# Patient Record
Sex: Male | Born: 1945 | Race: White | Hispanic: No | Marital: Married | Smoking: Never smoker
Health system: Southern US, Community
[De-identification: ages and names within clinical notes are randomized; demographics above are authoritative.]

## PROBLEM LIST (undated history)

## (undated) DIAGNOSIS — T7840XA Allergy, unspecified, initial encounter: Secondary | ICD-10-CM

## (undated) DIAGNOSIS — G250 Essential tremor: Secondary | ICD-10-CM

## (undated) DIAGNOSIS — I8290 Acute embolism and thrombosis of unspecified vein: Secondary | ICD-10-CM

## (undated) DIAGNOSIS — N201 Calculus of ureter: Secondary | ICD-10-CM

## (undated) DIAGNOSIS — Z87442 Personal history of urinary calculi: Secondary | ICD-10-CM

## (undated) HISTORY — PX: COLONOSCOPY: SHX174

## (undated) HISTORY — PX: OTHER SURGICAL HISTORY: SHX169

---

## 1996-10-02 HISTORY — PX: OTHER SURGICAL HISTORY: SHX169

## 1999-11-24 ENCOUNTER — Encounter: Admission: RE | Admit: 1999-11-24 | Discharge: 1999-11-24 | Payer: Self-pay | Admitting: Gastroenterology

## 1999-11-24 ENCOUNTER — Encounter: Payer: Self-pay | Admitting: Gastroenterology

## 2002-10-02 DIAGNOSIS — I8290 Acute embolism and thrombosis of unspecified vein: Secondary | ICD-10-CM

## 2002-10-02 HISTORY — DX: Acute embolism and thrombosis of unspecified vein: I82.90

## 2007-05-27 ENCOUNTER — Encounter: Admission: RE | Admit: 2007-05-27 | Discharge: 2007-05-27 | Payer: Self-pay | Admitting: Family Medicine

## 2013-07-03 ENCOUNTER — Other Ambulatory Visit: Payer: Self-pay | Admitting: Internal Medicine

## 2013-07-03 DIAGNOSIS — R109 Unspecified abdominal pain: Secondary | ICD-10-CM

## 2013-07-07 ENCOUNTER — Ambulatory Visit
Admission: RE | Admit: 2013-07-07 | Discharge: 2013-07-07 | Disposition: A | Discharge status: Home / Self Care | Payer: BC Managed Care – PPO | Source: Ambulatory Visit | Attending: Internal Medicine | Admitting: Internal Medicine

## 2013-07-07 ENCOUNTER — Other Ambulatory Visit: Payer: Self-pay

## 2013-07-07 DIAGNOSIS — R109 Unspecified abdominal pain: Secondary | ICD-10-CM

## 2013-07-07 MED ORDER — IOHEXOL 300 MG/ML  SOLN
100.0000 mL | Freq: Once | INTRAMUSCULAR | Status: AC | PRN
  Administered 2013-07-07: 100 mL via INTRAVENOUS

## 2016-10-09 DIAGNOSIS — J301 Allergic rhinitis due to pollen: Secondary | ICD-10-CM | POA: Diagnosis not present

## 2016-10-13 DIAGNOSIS — J301 Allergic rhinitis due to pollen: Secondary | ICD-10-CM | POA: Diagnosis not present

## 2016-11-08 DIAGNOSIS — J301 Allergic rhinitis due to pollen: Secondary | ICD-10-CM | POA: Diagnosis not present

## 2016-12-05 DIAGNOSIS — J301 Allergic rhinitis due to pollen: Secondary | ICD-10-CM | POA: Diagnosis not present

## 2017-01-01 DIAGNOSIS — J301 Allergic rhinitis due to pollen: Secondary | ICD-10-CM | POA: Diagnosis not present

## 2017-01-09 DIAGNOSIS — Z125 Encounter for screening for malignant neoplasm of prostate: Secondary | ICD-10-CM | POA: Diagnosis not present

## 2017-01-09 DIAGNOSIS — Z1389 Encounter for screening for other disorder: Secondary | ICD-10-CM | POA: Diagnosis not present

## 2017-01-09 DIAGNOSIS — Z0000 Encounter for general adult medical examination without abnormal findings: Secondary | ICD-10-CM | POA: Diagnosis not present

## 2017-01-09 DIAGNOSIS — E785 Hyperlipidemia, unspecified: Secondary | ICD-10-CM | POA: Diagnosis not present

## 2017-01-30 DIAGNOSIS — J301 Allergic rhinitis due to pollen: Secondary | ICD-10-CM | POA: Diagnosis not present

## 2017-02-01 DIAGNOSIS — J301 Allergic rhinitis due to pollen: Secondary | ICD-10-CM | POA: Diagnosis not present

## 2017-02-05 DIAGNOSIS — J301 Allergic rhinitis due to pollen: Secondary | ICD-10-CM | POA: Diagnosis not present

## 2017-02-07 DIAGNOSIS — J301 Allergic rhinitis due to pollen: Secondary | ICD-10-CM | POA: Diagnosis not present

## 2017-02-14 DIAGNOSIS — J3089 Other allergic rhinitis: Secondary | ICD-10-CM | POA: Diagnosis not present

## 2017-02-14 DIAGNOSIS — J301 Allergic rhinitis due to pollen: Secondary | ICD-10-CM | POA: Diagnosis not present

## 2017-02-14 DIAGNOSIS — J3081 Allergic rhinitis due to animal (cat) (dog) hair and dander: Secondary | ICD-10-CM | POA: Diagnosis not present

## 2017-03-12 DIAGNOSIS — J301 Allergic rhinitis due to pollen: Secondary | ICD-10-CM | POA: Diagnosis not present

## 2017-03-21 DIAGNOSIS — M2042 Other hammer toe(s) (acquired), left foot: Secondary | ICD-10-CM | POA: Diagnosis not present

## 2017-03-21 DIAGNOSIS — M7741 Metatarsalgia, right foot: Secondary | ICD-10-CM | POA: Diagnosis not present

## 2017-04-10 DIAGNOSIS — J301 Allergic rhinitis due to pollen: Secondary | ICD-10-CM | POA: Diagnosis not present

## 2017-05-07 DIAGNOSIS — J301 Allergic rhinitis due to pollen: Secondary | ICD-10-CM | POA: Diagnosis not present

## 2017-06-08 DIAGNOSIS — J301 Allergic rhinitis due to pollen: Secondary | ICD-10-CM | POA: Diagnosis not present

## 2017-07-05 DIAGNOSIS — J301 Allergic rhinitis due to pollen: Secondary | ICD-10-CM | POA: Diagnosis not present

## 2017-07-05 DIAGNOSIS — Z23 Encounter for immunization: Secondary | ICD-10-CM | POA: Diagnosis not present

## 2017-07-16 DIAGNOSIS — M25561 Pain in right knee: Secondary | ICD-10-CM | POA: Diagnosis not present

## 2017-07-16 DIAGNOSIS — M25562 Pain in left knee: Secondary | ICD-10-CM | POA: Diagnosis not present

## 2017-07-16 DIAGNOSIS — M25571 Pain in right ankle and joints of right foot: Secondary | ICD-10-CM | POA: Diagnosis not present

## 2017-07-16 DIAGNOSIS — M25572 Pain in left ankle and joints of left foot: Secondary | ICD-10-CM | POA: Diagnosis not present

## 2017-07-27 DIAGNOSIS — M79671 Pain in right foot: Secondary | ICD-10-CM | POA: Diagnosis not present

## 2017-08-06 DIAGNOSIS — J301 Allergic rhinitis due to pollen: Secondary | ICD-10-CM | POA: Diagnosis not present

## 2017-08-22 DIAGNOSIS — J45909 Unspecified asthma, uncomplicated: Secondary | ICD-10-CM | POA: Diagnosis not present

## 2017-08-22 DIAGNOSIS — J019 Acute sinusitis, unspecified: Secondary | ICD-10-CM | POA: Diagnosis not present

## 2017-08-22 DIAGNOSIS — J3089 Other allergic rhinitis: Secondary | ICD-10-CM | POA: Diagnosis not present

## 2017-08-22 DIAGNOSIS — R110 Nausea: Secondary | ICD-10-CM | POA: Diagnosis not present

## 2017-08-30 DIAGNOSIS — Z6827 Body mass index (BMI) 27.0-27.9, adult: Secondary | ICD-10-CM | POA: Diagnosis not present

## 2017-08-30 DIAGNOSIS — Z91048 Other nonmedicinal substance allergy status: Secondary | ICD-10-CM | POA: Diagnosis not present

## 2017-08-30 DIAGNOSIS — R001 Bradycardia, unspecified: Secondary | ICD-10-CM | POA: Diagnosis not present

## 2017-08-30 DIAGNOSIS — R251 Tremor, unspecified: Secondary | ICD-10-CM | POA: Diagnosis not present

## 2017-08-30 DIAGNOSIS — J301 Allergic rhinitis due to pollen: Secondary | ICD-10-CM | POA: Diagnosis not present

## 2017-08-30 DIAGNOSIS — Z91010 Allergy to peanuts: Secondary | ICD-10-CM | POA: Diagnosis not present

## 2017-08-30 DIAGNOSIS — E663 Overweight: Secondary | ICD-10-CM | POA: Diagnosis not present

## 2017-08-30 DIAGNOSIS — Z79899 Other long term (current) drug therapy: Secondary | ICD-10-CM | POA: Diagnosis not present

## 2017-08-30 DIAGNOSIS — Z0000 Encounter for general adult medical examination without abnormal findings: Secondary | ICD-10-CM | POA: Diagnosis not present

## 2017-08-30 DIAGNOSIS — J209 Acute bronchitis, unspecified: Secondary | ICD-10-CM | POA: Diagnosis not present

## 2017-08-30 DIAGNOSIS — J3081 Allergic rhinitis due to animal (cat) (dog) hair and dander: Secondary | ICD-10-CM | POA: Diagnosis not present

## 2017-09-03 DIAGNOSIS — J3081 Allergic rhinitis due to animal (cat) (dog) hair and dander: Secondary | ICD-10-CM | POA: Diagnosis not present

## 2017-09-03 DIAGNOSIS — J3089 Other allergic rhinitis: Secondary | ICD-10-CM | POA: Diagnosis not present

## 2017-09-03 DIAGNOSIS — R05 Cough: Secondary | ICD-10-CM | POA: Diagnosis not present

## 2017-09-03 DIAGNOSIS — J301 Allergic rhinitis due to pollen: Secondary | ICD-10-CM | POA: Diagnosis not present

## 2017-10-04 DIAGNOSIS — J301 Allergic rhinitis due to pollen: Secondary | ICD-10-CM | POA: Diagnosis not present

## 2017-10-10 DIAGNOSIS — J301 Allergic rhinitis due to pollen: Secondary | ICD-10-CM | POA: Diagnosis not present

## 2017-10-29 DIAGNOSIS — J301 Allergic rhinitis due to pollen: Secondary | ICD-10-CM | POA: Diagnosis not present

## 2017-10-31 DIAGNOSIS — J301 Allergic rhinitis due to pollen: Secondary | ICD-10-CM | POA: Diagnosis not present

## 2017-11-05 DIAGNOSIS — J301 Allergic rhinitis due to pollen: Secondary | ICD-10-CM | POA: Diagnosis not present

## 2017-11-08 DIAGNOSIS — J301 Allergic rhinitis due to pollen: Secondary | ICD-10-CM | POA: Diagnosis not present

## 2017-11-12 DIAGNOSIS — J301 Allergic rhinitis due to pollen: Secondary | ICD-10-CM | POA: Diagnosis not present

## 2017-11-14 DIAGNOSIS — J301 Allergic rhinitis due to pollen: Secondary | ICD-10-CM | POA: Diagnosis not present

## 2017-11-19 DIAGNOSIS — M7741 Metatarsalgia, right foot: Secondary | ICD-10-CM | POA: Diagnosis not present

## 2017-11-19 DIAGNOSIS — M7742 Metatarsalgia, left foot: Secondary | ICD-10-CM | POA: Diagnosis not present

## 2017-11-19 DIAGNOSIS — M79672 Pain in left foot: Secondary | ICD-10-CM | POA: Diagnosis not present

## 2017-11-19 DIAGNOSIS — J301 Allergic rhinitis due to pollen: Secondary | ICD-10-CM | POA: Diagnosis not present

## 2017-11-19 DIAGNOSIS — M79671 Pain in right foot: Secondary | ICD-10-CM | POA: Diagnosis not present

## 2017-11-21 DIAGNOSIS — J301 Allergic rhinitis due to pollen: Secondary | ICD-10-CM | POA: Diagnosis not present

## 2017-11-28 DIAGNOSIS — K521 Toxic gastroenteritis and colitis: Secondary | ICD-10-CM | POA: Diagnosis not present

## 2017-12-18 DIAGNOSIS — J301 Allergic rhinitis due to pollen: Secondary | ICD-10-CM | POA: Diagnosis not present

## 2017-12-31 DIAGNOSIS — M79671 Pain in right foot: Secondary | ICD-10-CM | POA: Diagnosis not present

## 2017-12-31 DIAGNOSIS — M7741 Metatarsalgia, right foot: Secondary | ICD-10-CM | POA: Diagnosis not present

## 2018-01-15 DIAGNOSIS — J301 Allergic rhinitis due to pollen: Secondary | ICD-10-CM | POA: Diagnosis not present

## 2018-01-28 DIAGNOSIS — Z1389 Encounter for screening for other disorder: Secondary | ICD-10-CM | POA: Diagnosis not present

## 2018-01-28 DIAGNOSIS — Z0000 Encounter for general adult medical examination without abnormal findings: Secondary | ICD-10-CM | POA: Diagnosis not present

## 2018-01-28 DIAGNOSIS — Z125 Encounter for screening for malignant neoplasm of prostate: Secondary | ICD-10-CM | POA: Diagnosis not present

## 2018-02-12 DIAGNOSIS — J301 Allergic rhinitis due to pollen: Secondary | ICD-10-CM | POA: Diagnosis not present

## 2018-03-11 DIAGNOSIS — J301 Allergic rhinitis due to pollen: Secondary | ICD-10-CM | POA: Diagnosis not present

## 2018-03-11 DIAGNOSIS — J3089 Other allergic rhinitis: Secondary | ICD-10-CM | POA: Diagnosis not present

## 2018-03-13 DIAGNOSIS — R69 Illness, unspecified: Secondary | ICD-10-CM | POA: Diagnosis not present

## 2018-04-01 DIAGNOSIS — N201 Calculus of ureter: Secondary | ICD-10-CM

## 2018-04-01 HISTORY — DX: Calculus of ureter: N20.1

## 2018-04-10 DIAGNOSIS — M25511 Pain in right shoulder: Secondary | ICD-10-CM | POA: Diagnosis not present

## 2018-04-10 DIAGNOSIS — S4991XA Unspecified injury of right shoulder and upper arm, initial encounter: Secondary | ICD-10-CM | POA: Diagnosis not present

## 2018-04-11 DIAGNOSIS — J301 Allergic rhinitis due to pollen: Secondary | ICD-10-CM | POA: Diagnosis not present

## 2018-04-13 ENCOUNTER — Emergency Department (HOSPITAL_COMMUNITY)
Admission: EM | Admit: 2018-04-13 | Discharge: 2018-04-13 | Disposition: A | Discharge status: Home / Self Care | Payer: Medicare HMO | Attending: Emergency Medicine | Admitting: Emergency Medicine

## 2018-04-13 ENCOUNTER — Emergency Department (HOSPITAL_COMMUNITY): Payer: Medicare HMO

## 2018-04-13 ENCOUNTER — Encounter (HOSPITAL_COMMUNITY): Payer: Self-pay | Admitting: Emergency Medicine

## 2018-04-13 DIAGNOSIS — R109 Unspecified abdominal pain: Secondary | ICD-10-CM | POA: Diagnosis present

## 2018-04-13 DIAGNOSIS — N132 Hydronephrosis with renal and ureteral calculous obstruction: Secondary | ICD-10-CM | POA: Diagnosis not present

## 2018-04-13 DIAGNOSIS — R110 Nausea: Secondary | ICD-10-CM | POA: Diagnosis not present

## 2018-04-13 DIAGNOSIS — Z79899 Other long term (current) drug therapy: Secondary | ICD-10-CM | POA: Insufficient documentation

## 2018-04-13 DIAGNOSIS — N23 Unspecified renal colic: Secondary | ICD-10-CM | POA: Insufficient documentation

## 2018-04-13 DIAGNOSIS — R1031 Right lower quadrant pain: Secondary | ICD-10-CM | POA: Diagnosis not present

## 2018-04-13 LAB — TROPONIN I

## 2018-04-13 LAB — COMPREHENSIVE METABOLIC PANEL
ALT: 17 U/L (ref 0–44)
ANION GAP: 10 (ref 5–15)
AST: 30 U/L (ref 15–41)
Albumin: 4.1 g/dL (ref 3.5–5.0)
Alkaline Phosphatase: 74 U/L (ref 38–126)
BUN: 8 mg/dL (ref 8–23)
CO2: 27 mmol/L (ref 22–32)
CREATININE: 0.98 mg/dL (ref 0.61–1.24)
Calcium: 9.3 mg/dL (ref 8.9–10.3)
Chloride: 103 mmol/L (ref 98–111)
Glucose, Bld: 136 mg/dL — ABNORMAL HIGH (ref 70–99)
Potassium: 3.7 mmol/L (ref 3.5–5.1)
SODIUM: 140 mmol/L (ref 135–145)
Total Bilirubin: 0.9 mg/dL (ref 0.3–1.2)
Total Protein: 6.7 g/dL (ref 6.5–8.1)

## 2018-04-13 LAB — CBC
HCT: 47.4 % (ref 39.0–52.0)
HEMOGLOBIN: 15.3 g/dL (ref 13.0–17.0)
MCH: 29.4 pg (ref 26.0–34.0)
MCHC: 32.3 g/dL (ref 30.0–36.0)
MCV: 91.2 fL (ref 78.0–100.0)
PLATELETS: 217 10*3/uL (ref 150–400)
RBC: 5.2 MIL/uL (ref 4.22–5.81)
RDW: 12.6 % (ref 11.5–15.5)
WBC: 7.8 10*3/uL (ref 4.0–10.5)

## 2018-04-13 LAB — LIPASE, BLOOD: Lipase: 62 U/L — ABNORMAL HIGH (ref 11–51)

## 2018-04-13 MED ORDER — TAMSULOSIN HCL 0.4 MG PO CAPS: 0.4000 mg | ORAL_CAPSULE | Freq: Every day | ORAL | 0 refills | Status: AC

## 2018-04-13 MED ORDER — ONDANSETRON HCL 4 MG PO TABS: 4.0000 mg | ORAL_TABLET | Freq: Four times a day (QID) | ORAL | 0 refills | Status: DC | PRN

## 2018-04-13 MED ORDER — OXYCODONE-ACETAMINOPHEN 5-325 MG PO TABS
1.0000 | ORAL_TABLET | Freq: Once | ORAL | Status: AC
  Administered 2018-04-13: 1 via ORAL
  Filled 2018-04-13: qty 1

## 2018-04-13 MED ORDER — OXYCODONE-ACETAMINOPHEN 5-325 MG PO TABS: 1.0000 | ORAL_TABLET | ORAL | 0 refills | Status: DC | PRN

## 2018-04-13 MED ORDER — FENTANYL CITRATE (PF) 100 MCG/2ML IJ SOLN
50.0000 ug | Freq: Once | INTRAMUSCULAR | Status: AC
  Administered 2018-04-13: 50 ug via INTRAVENOUS
  Filled 2018-04-13: qty 2

## 2018-04-13 NOTE — ED Notes (Signed)
Pt states he has a hx of his heart rate being in the 40s. Not symptomatic.

## 2018-04-13 NOTE — ED Triage Notes (Signed)
Reports pain on right lower abdomen just under ribcage that started after eating fruit.  Endorses diarrhea.  No n/v.

## 2018-04-14 NOTE — ED Provider Notes (Signed)
[facility] Provider Note   CSN: [id number] Arrival date & time: 04/13/18  1915     History   Chief Complaint Chief Complaint  Patient presents with  . Abdominal Pain    HPI Todd Dawson is a 72 y.o. male.  HPI Patient presents with right flank and lower abdominal pain that started this morning.  States the pain waxes and wanes.  Initially had some nausea which has improved.  Denies hematuria but states he has had some urinary hesitancy.  No fever or chills.  History of previous kidney stones.  Patient denies dizziness or lightheadedness.  States his heart rate is normally in the 40s and 50s.  Denies testicular or scrotal swelling or pain. Past Medical History:  Diagnosis Date  . Tremor     There are no active problems to display for this patient.   History reviewed. No pertinent surgical history.      Home Medications    Prior to Admission medications   Medication Sig Start Date End Date Taking? Authorizing Provider  Ascorbic Acid (VITAMIN C PO) Take 1 tablet by mouth daily.   Yes [provider]  atenolol (TENORMIN) 25 MG tablet Take 50 mg by mouth daily. 01/17/18  Yes [provider]  Cholecalciferol (VITAMIN D PO) Take 1 tablet by mouth daily.   Yes [provider]  Multiple Vitamin (MULTIVITAMIN WITH MINERALS) TABS tablet Take 1 tablet by mouth daily.   Yes [provider]  ondansetron (ZOFRAN) 4 MG tablet Take 1 tablet (4 mg total) by mouth every 6 (six) hours as needed for nausea or vomiting. 04/13/18   Loren RacerYelverton, Kao Conry, MD  oxyCODONE-acetaminophen (PERCOCET) 5-325 MG tablet Take 1-2 tablets by mouth every 4 (four) hours as needed for severe pain. 04/13/18   Loren RacerYelverton, Aleria Maheu, MD  tamsulosin (FLOMAX) 0.4 MG CAPS capsule Take 1 capsule (0.4 mg total) by mouth daily. 04/13/18   Loren RacerYelverton, Kale Rondeau, MD    Family History No family history on file.  Social History Social History   Tobacco Use    . Smoking status: Never Smoker  . Smokeless tobacco: Never Used  Substance Use Topics  . Alcohol use: Never    Frequency: Never  . Drug use: Never     Allergies   Patient has no known allergies.   Review of Systems Review of Systems  Constitutional: Negative for chills and fever.  Respiratory: Negative for cough and shortness of breath.   Cardiovascular: Negative for chest pain.  Gastrointestinal: Positive for abdominal pain and nausea. Negative for constipation, diarrhea and vomiting.  Genitourinary: Positive for difficulty urinating and flank pain. Negative for dysuria, frequency, hematuria, penile pain, penile swelling, scrotal swelling and testicular pain.  Musculoskeletal: Positive for back pain. Negative for myalgias, neck pain and neck stiffness.  Skin: Negative for rash and wound.  Neurological: Negative for dizziness, weakness, numbness and headaches.  All other systems reviewed and are negative.    Physical Exam Updated Vital Signs BP (!) 142/84   Pulse (!) 48   Temp 97.7 F (36.5 C) (Oral)   Resp 14   Ht 5' 10.5" (1.791 m)   Wt 79.8 kg (176 lb)   SpO2 98%   BMI 24.90 kg/m   Physical Exam  Constitutional: He is oriented to person, place, and time. He appears well-developed and well-nourished.  HENT:  Head: Normocephalic and atraumatic.  Mouth/Throat: Oropharynx is clear and moist.  Eyes: Pupils are equal, round, and reactive to light. EOM  are normal.  Neck: Normal range of motion. Neck supple.  Cardiovascular: Normal rate and regular rhythm.  Pulmonary/Chest: Effort normal and breath sounds normal.  Abdominal: Soft. Bowel sounds are normal. There is tenderness. There is no rebound and no guarding.  Very mild right lower quadrant tenderness to palpation.  No rebound or guarding.  Musculoskeletal: Normal range of motion. He exhibits no edema or tenderness.  Mild right-sided CVA tenderness.  No midline thoracic or lumbar tenderness.  No lower extremity  swelling, asymmetry or tenderness.  Neurological: He is alert and oriented to person, place, and time.  Moving all extremities without focal deficit.  Sensation intact.  Skin: Skin is warm and dry. Capillary refill takes less than 2 seconds. No rash noted. No erythema.  Psychiatric: He has a normal mood and affect. His behavior is normal.  Nursing note and vitals reviewed.    ED Treatments / Results  Labs (all labs ordered are listed, but only abnormal results are displayed) Labs Reviewed  LIPASE, BLOOD - Abnormal; Notable for the following components:      Result Value   Lipase 62 (*)    All other components within normal limits  COMPREHENSIVE METABOLIC PANEL - Abnormal; Notable for the following components:   Glucose, Bld 136 (*)    All other components within normal limits  CBC  TROPONIN I    EKG None  Radiology Ct Renal Stone Study  Result Date: 04/13/2018 CLINICAL DATA:  Right upper quadrant pain. EXAM: CT ABDOMEN AND PELVIS WITHOUT CONTRAST TECHNIQUE: Multidetector CT imaging of the abdomen and pelvis was performed following the standard protocol without IV contrast. COMPARISON:  07/07/2013 FINDINGS: Lower chest: Normal Hepatobiliary: No focal hepatic parenchymal abnormality. Somewhat diminutive left lobe. No calcified gallstones. Pancreas: Normal Spleen: Normal Adrenals/Urinary Tract: Adrenal glands are normal. Left kidney is normal except for a 2 cm cyst in the midportion. The right kidney shows hydronephrosis and surrounding edema. Two stones previously seen in the lower pole caliceal system are now present at the right UPJ responsible for the renal obstruction. Each of these stones measures about 7 8 mm in diameter. No stones seen more distally within the ureter or within the bladder. 2 mm nonobstructing stone in the midportion of the right kidney. Stomach/Bowel: Sigmoid diverticulosis without evidence of diverticulitis. No other bowel finding of note. Vascular/Lymphatic:  Aorta and IVC are normal. No retroperitoneal adenopathy. Reproductive: Right testicle appears to be present within the inguinal canal. Possible associated epididymal cyst. Other: No free fluid or air. Musculoskeletal: Lower lumbar degenerative changes. IMPRESSION: Hydronephrosis on the right due 2 at least 2 stones impacted at the right UPJ. These appear to be about 8 mm in size each. Right testicle present within the inguinal canal. Cyst proximal to the testicle presumed to be an epididymal cyst. Electronically Signed   By: Paulina Fusi M.D.   On: 04/13/2018 21:54    Procedures Procedures (including critical care time)  Medications Ordered in ED Medications  fentaNYL (SUBLIMAZE) injection 50 mcg (50 mcg Intravenous Given 04/13/18 2127)  oxyCODONE-acetaminophen (PERCOCET/ROXICET) 5-325 MG per tablet 1 tablet (1 tablet Oral Given 04/13/18 2230)     Initial Impression / Assessment and Plan / ED Course  I have reviewed the triage vital signs and the nursing notes.  Pertinent labs & imaging results that were available during my care of the patient were reviewed by me and considered in my medical decision making (see chart for details).     Patient's pain is completely resolved.  Evidence of 2 obstructing renal stones on the right.  Will need to follow-up with urology.  Return precautions given.  Final Clinical Impressions(s) / ED Diagnoses   Final diagnoses:  Renal colic on right side    ED Discharge Orders        Ordered    oxyCODONE-acetaminophen (PERCOCET) 5-325 MG tablet  Every 4 hours PRN     04/13/18 2317    ondansetron (ZOFRAN) 4 MG tablet  Every 6 hours PRN     04/13/18 2317    tamsulosin (FLOMAX) 0.4 MG CAPS capsule  Daily     04/13/18 2317       Loren Racer, MD 04/14/18 2239

## 2018-04-15 ENCOUNTER — Other Ambulatory Visit: Payer: Self-pay | Admitting: Urology

## 2018-04-15 DIAGNOSIS — N200 Calculus of kidney: Secondary | ICD-10-CM | POA: Diagnosis not present

## 2018-04-17 NOTE — H&P (Signed)
CC: I have kidney stones.  HPI: Todd Dawson is a 72 year-old male patient who was referred by Dr. Derenda Fennelavid P. Yelverton, MD who is here for renal calculi.  The problem is on the right side. He first stated noticing pain on 04/13/2018.   Todd Dawson is a 72 yo WM who was seen in the ER on 7/13 for mod-severe right flank pain that began that day and progressively worsened through the day. He was found to CT to have 2 8mm stones in the right UPJ with obstruction. He had some nausea but no fever or hematuria. A UA wasn't done. The stone was in the RLP in 2014 on CT. He had a prior stone about 20 years ago. He passed that stone. He feels better today but he is on the percocet.      CC: AUA Questions Scoring.  HPI:     AUA Symptom Score: He never has the sensation of not emptying his bladder completely after finishing urinating. Less than 20% of the time he has to urinate again fewer than two hours after he has finished urinating. Less than 20% of the time he has to start and stop again several times when he urinates. Less than 20% of the time he finds it difficult to postpone urination. He never has a weak urinary stream. He never has to push or strain to begin urination. He never has to get up to urinate from the time he goes to bed until the time he gets up in the morning.   Calculated AUA Symptom Score: 3    ALLERGIES: None   MEDICATIONS: Atenolol 50 mg tablet     GU PSH: None   NON-GU PSH: None   GU PMH: None   NON-GU PMH: None   FAMILY HISTORY: 2 sons - No Family History heart failure - Father, Mother   SOCIAL HISTORY: Marital Status: Married Preferred Language: English; Race: White Current Smoking Status: Patient has never smoked.   Tobacco Use Assessment Completed: Used Tobacco in last 30 days? Has never drank.  Drinks 2 caffeinated drinks per day. Has not had a blood transfusion.    REVIEW OF SYSTEMS:    GU Review Male:   Patient denies frequent urination, hard to  postpone urination, burning/ pain with urination, get up at night to urinate, leakage of urine, stream starts and stops, trouble starting your stream, have to strain to urinate , erection problems, and penile pain.  Gastrointestinal (Upper):   Patient denies nausea, vomiting, and indigestion/ heartburn.  Gastrointestinal (Lower):   Patient denies diarrhea and constipation.  Constitutional:   Patient denies fever, night sweats, weight loss, and fatigue.  Skin:   Patient denies skin rash/ lesion and itching.  Eyes:   Patient denies blurred vision and double vision.  Ears/ Nose/ Throat:   Patient denies sinus problems and sore throat.  Hematologic/Lymphatic:   Patient denies swollen glands and easy bruising.  Cardiovascular:   Patient denies leg swelling and chest pains.  Respiratory:   Patient denies cough and shortness of breath.  Endocrine:   Patient denies excessive thirst.  Musculoskeletal:   Patient denies back pain and joint pain.  Neurological:   Patient denies headaches and dizziness.  Psychologic:   Patient denies depression and anxiety.   Notes: Kidney Stones     VITAL SIGNS:      04/15/2018 09:36 AM  Weight 175 lb / 79.38 kg  Height 71 in / 180.34 cm  BP 117/76 mmHg  Temperature  97.3 F / 36.2 C  BMI 24.4 kg/m   MULTI-SYSTEM PHYSICAL EXAMINATION:    Constitutional: Well-nourished. No physical deformities. Normally developed. Good grooming.  Neck: Neck symmetrical, not swollen. Normal tracheal position.  Respiratory: No labored breathing, no use of accessory muscles. Normal breath sounds.  Cardiovascular: Regular rate and rhythm. No murmur, no gallop.   Lymphatic: No enlargement of neck, axillae, groin.  Skin: No paleness, no jaundice, no cyanosis. No lesion, no ulcer, no rash.  Neurologic / Psychiatric: Oriented to time, oriented to place, oriented to person. No depression, no anxiety, no agitation.  Gastrointestinal: No mass, no tenderness, no rigidity, non obese abdomen.    Musculoskeletal: Normal gait and station of head and neck.     PAST DATA REVIEWED:  Source Of History:  Patient  Lab Test Review:   CBC with Diff, CMP  Records Review:   AUA Symptom Score  X-Ray Review: K[facility]: Reviewed Films. Discussed With Patient.  C.T. Stone Protocol: Reviewed Films. Reviewed Report. Discussed With Patient. 2 8mm right UPJ stones.    Notes:                     Labs were OK. ER records reviewed.    PROCEDURES:         K[facility] - [id number]  A single view of the abdomen is obtained. The two 8mm right proximal stones are unchanged in location. There is a 5mm left pelvic calcification that was not commented on in the CT report but is most consistent with a phlebolith. No bone, gas or soft tissue abnormalities are noted.          ASSESSMENT:      ICD-10 Details  1 GU:   Renal calculus - N20.0 He has 2 8mm right proximal stones at the level of the UPJ/proximal ureter with no progression since the CT on 7/13. I discussed the options with him including ESWL and URS. I don't think the stone is likely to pass. He would like ESWL. I reviewed the risks of ESWL including bleeding, infection, injury to the kidney or adjacent structures, failure to fragment the stone, need for ancillary procedures, thrombotic events, cardiac arrhythmias and sedation complications. I have refilled the percocet. He will continue to try to get a UA since we don't have one yet.    PLAN:            Medications New Meds: Oxycodone-Acetaminophen 5 mg-325 mg tablet 1 tablet PO Q 6 H PRN   #15  0 Refill(s)            Orders X-Rays: KUB          Schedule Return Visit/Planned Activity: ASAP - Schedule Surgery             Note: ESWL          Document Letter(s):  Created for Patient: Clinical Summary

## 2018-04-18 ENCOUNTER — Encounter (HOSPITAL_COMMUNITY)
Admission: RE | Disposition: A | Discharge status: Home / Self Care | Payer: Self-pay | Source: Ambulatory Visit | Attending: Urology

## 2018-04-18 ENCOUNTER — Encounter (HOSPITAL_COMMUNITY): Payer: Self-pay

## 2018-04-18 ENCOUNTER — Ambulatory Visit (HOSPITAL_COMMUNITY)
Admission: RE | Admit: 2018-04-18 | Discharge: 2018-04-18 | Disposition: A | Discharge status: Home / Self Care | Payer: Medicare HMO | Source: Ambulatory Visit | Attending: Urology | Admitting: Urology

## 2018-04-18 ENCOUNTER — Ambulatory Visit (HOSPITAL_COMMUNITY): Payer: Medicare HMO

## 2018-04-18 DIAGNOSIS — N201 Calculus of ureter: Secondary | ICD-10-CM | POA: Diagnosis not present

## 2018-04-18 HISTORY — PX: EXTRACORPOREAL SHOCK WAVE LITHOTRIPSY: SHX1557

## 2018-04-18 HISTORY — DX: Acute embolism and thrombosis of unspecified vein: I82.90

## 2018-04-18 SURGERY — LITHOTRIPSY, ESWL
Anesthesia: LOCAL | Laterality: Right

## 2018-04-18 MED ORDER — OXYCODONE HCL 5 MG PO TABS: 5.0000 mg | ORAL_TABLET | ORAL | Status: DC | PRN

## 2018-04-18 MED ORDER — ACETAMINOPHEN 325 MG PO TABS: 650.0000 mg | ORAL_TABLET | ORAL | Status: DC | PRN

## 2018-04-18 MED ORDER — SODIUM CHLORIDE 0.9 % IV SOLN: 250.0000 mL | INTRAVENOUS | Status: DC | PRN

## 2018-04-18 MED ORDER — SODIUM CHLORIDE 0.9% FLUSH: 3.0000 mL | Freq: Two times a day (BID) | INTRAVENOUS | Status: DC

## 2018-04-18 MED ORDER — CIPROFLOXACIN HCL 500 MG PO TABS
500.0000 mg | ORAL_TABLET | ORAL | Status: AC
  Administered 2018-04-18: 500 mg via ORAL
  Filled 2018-04-18: qty 1

## 2018-04-18 MED ORDER — SODIUM CHLORIDE 0.9% FLUSH: 3.0000 mL | INTRAVENOUS | Status: DC | PRN

## 2018-04-18 MED ORDER — MORPHINE SULFATE (PF) 4 MG/ML IV SOLN: 2.0000 mg | INTRAVENOUS | Status: DC | PRN

## 2018-04-18 MED ORDER — SODIUM CHLORIDE 0.9 % IV SOLN
INTRAVENOUS | Status: DC
  Administered 2018-04-18: 15:00:00 via INTRAVENOUS

## 2018-04-18 MED ORDER — DIAZEPAM 5 MG PO TABS
10.0000 mg | ORAL_TABLET | ORAL | Status: AC
  Administered 2018-04-18: 10 mg via ORAL
  Filled 2018-04-18: qty 2

## 2018-04-18 MED ORDER — OXYCODONE-ACETAMINOPHEN 5-325 MG PO TABS: 1.0000 | ORAL_TABLET | Freq: Four times a day (QID) | ORAL | 0 refills | Status: AC | PRN

## 2018-04-18 MED ORDER — DIPHENHYDRAMINE HCL 25 MG PO CAPS
25.0000 mg | ORAL_CAPSULE | ORAL | Status: AC
  Administered 2018-04-18: 25 mg via ORAL
  Filled 2018-04-18: qty 1

## 2018-04-18 MED ORDER — ACETAMINOPHEN 650 MG RE SUPP
650.0000 mg | RECTAL | Status: DC | PRN
  Filled 2018-04-18: qty 1

## 2018-04-18 NOTE — Interval H&P Note (Signed)
History and Physical Interval Note:  04/18/2018 4:48 PM  Todd Dawson  has presented today for surgery, with the diagnosis of RIGHT PROXIMAL  STONE  The various methods of treatment have been discussed with the patient and family. After consideration of risks, benefits and other options for treatment, the patient has consented to  Procedure(s): RIGHT EXTRACORPOREAL SHOCK WAVE LITHOTRIPSY (ESWL) (Right) as a surgical intervention .  The patient's history has been reviewed, patient examined, no change in status, stable for surgery.  I have reviewed the patient's chart and labs.  Questions were answered to the patient's satisfaction.     Todd Dawson

## 2018-04-18 NOTE — Discharge Instructions (Signed)
Lithotripsy, Care After °This sheet gives you information about how to care for yourself after your procedure. Your health care provider may also give you more specific instructions. If you have problems or questions, contact your health care provider. °What can I expect after the procedure? °After the procedure, it is common to have: °· Some blood in your urine. This should only last for a few days. °· Soreness in your back, sides, or upper abdomen for a few days. °· Blotches or bruises on your back where the pressure wave entered the skin. °· Pain, discomfort, or nausea when pieces (fragments) of the kidney stone move through the tube that carries urine from the kidney to the bladder (ureter). Stone fragments may pass soon after the procedure, but they may continue to pass for up to 4-8 weeks. °? If you have severe pain or nausea, contact your health care provider. This may be caused by a large stone that was not broken up, and this may mean that you need more treatment. °· Some pain or discomfort during urination. °· Some pain or discomfort in the lower abdomen or (in men) at the base of the penis. ° °Follow these instructions at home: °Medicines °· Take over-the-counter and prescription medicines only as told by your health care provider. °· If you were prescribed an antibiotic medicine, take it as told by your health care provider. Do not stop taking the antibiotic even if you start to feel better. °· Do not drive for 24 hours if you were given a medicine to help you relax (sedative). °· Do not drive or use heavy machinery while taking prescription pain medicine. °Eating and drinking °· Drink enough water and fluids to keep your urine clear or pale yellow. This helps any remaining pieces of the stone to pass. It can also help prevent new stones from forming. °· Eat plenty of fresh fruits and vegetables. °· Follow instructions from your health care provider about eating and drinking restrictions. You may be  instructed: °? To reduce how much salt (sodium) you eat or drink. Check ingredients and nutrition facts on packaged foods and beverages. °? To reduce how much meat you eat. °· Eat the recommended amount of calcium for your age and gender. Ask your health care provider how much calcium you should have. °General instructions °· Get plenty of rest. °· Most people can resume normal activities 1-2 days after the procedure. Ask your health care provider what activities are safe for you. °· If directed, strain all urine through the strainer that was provided by your health care provider. °? Keep all fragments for your health care provider to see. Any stones that are found may be sent to a medical lab for examination. The stone may be as small as a grain of salt. °· Keep all follow-up visits as told by your health care provider. This is important. °Contact a health care provider if: °· You have pain that is severe or does not get better with medicine. °· You have nausea that is severe or does not go away. °· You have blood in your urine longer than your health care provider told you to expect. °· You have more blood in your urine. °· You have pain during urination that does not go away. °· You urinate more frequently than usual and this does not go away. °· You develop a rash or any other possible signs of an allergic reaction. °Get help right away if: °· You have severe pain in   your back, sides, or upper abdomen. °· You have severe pain while urinating. °· Your urine is very dark red. °· You have blood in your stool (feces). °· You cannot pass any urine at all. °· You feel a strong urge to urinate after emptying your bladder. °· You have a fever or chills. °· You develop shortness of breath, difficulty breathing, or chest pain. °· You have severe nausea that leads to persistent vomiting. °· You faint. °Summary °· After this procedure, it is common to have some pain, discomfort, or nausea when pieces (fragments) of the  kidney stone move through the tube that carries urine from the kidney to the bladder (ureter). If this pain or nausea is severe, however, you should contact your health care provider. °· Most people can resume normal activities 1-2 days after the procedure. Ask your health care provider what activities are safe for you. °· Drink enough water and fluids to keep your urine clear or pale yellow. This helps any remaining pieces of the stone to pass, and it can help prevent new stones from forming. °· If directed, strain your urine and keep all fragments for your health care provider to see. Fragments or stones may be as small as a grain of salt. °· Get help right away if you have severe pain in your back, sides, or upper abdomen or have severe pain while urinating. °This information is not intended to replace advice given to you by your health care provider. Make sure you discuss any questions you have with your health care provider. °Document Released: 10/08/2007 Document Revised: 08/09/2016 Document Reviewed: 08/09/2016 °[employer] Interactive Patient Education © 2018 [employer] ° °

## 2018-05-02 DIAGNOSIS — N200 Calculus of kidney: Secondary | ICD-10-CM | POA: Diagnosis not present

## 2018-05-07 DIAGNOSIS — J301 Allergic rhinitis due to pollen: Secondary | ICD-10-CM | POA: Diagnosis not present

## 2018-05-10 DIAGNOSIS — N200 Calculus of kidney: Secondary | ICD-10-CM | POA: Diagnosis not present

## 2018-05-23 DIAGNOSIS — N200 Calculus of kidney: Secondary | ICD-10-CM | POA: Diagnosis not present

## 2018-06-04 DIAGNOSIS — J301 Allergic rhinitis due to pollen: Secondary | ICD-10-CM | POA: Diagnosis not present

## 2018-06-13 DIAGNOSIS — Z23 Encounter for immunization: Secondary | ICD-10-CM | POA: Diagnosis not present

## 2018-06-20 DIAGNOSIS — N200 Calculus of kidney: Secondary | ICD-10-CM | POA: Diagnosis not present

## 2018-07-01 DIAGNOSIS — J301 Allergic rhinitis due to pollen: Secondary | ICD-10-CM | POA: Diagnosis not present

## 2018-07-17 DIAGNOSIS — M25511 Pain in right shoulder: Secondary | ICD-10-CM | POA: Diagnosis not present

## 2018-07-18 DIAGNOSIS — N202 Calculus of kidney with calculus of ureter: Secondary | ICD-10-CM | POA: Diagnosis not present

## 2018-07-22 ENCOUNTER — Encounter (HOSPITAL_COMMUNITY): Payer: Self-pay | Admitting: Urology

## 2018-07-29 DIAGNOSIS — J301 Allergic rhinitis due to pollen: Secondary | ICD-10-CM | POA: Diagnosis not present

## 2018-08-01 DIAGNOSIS — N201 Calculus of ureter: Secondary | ICD-10-CM | POA: Diagnosis not present

## 2018-08-08 ENCOUNTER — Other Ambulatory Visit: Payer: Self-pay | Admitting: Urology

## 2018-08-15 DIAGNOSIS — N201 Calculus of ureter: Secondary | ICD-10-CM | POA: Diagnosis not present

## 2018-08-19 ENCOUNTER — Other Ambulatory Visit: Payer: Self-pay

## 2018-08-19 ENCOUNTER — Encounter (HOSPITAL_BASED_OUTPATIENT_CLINIC_OR_DEPARTMENT_OTHER): Payer: Self-pay

## 2018-08-19 DIAGNOSIS — M25511 Pain in right shoulder: Secondary | ICD-10-CM | POA: Diagnosis not present

## 2018-08-21 NOTE — H&P (Signed)
CC: I have kidney stones.  HPI: Todd Dawson is a 72 year-old male established patient who is here for renal calculi.  08/15/18: Todd Dawson present today with complaints of abdominal pain and weak urinary stream. He is scheduled for ureteroscopy on 11/21 with Dr. Annabell Howells. He states that current symptoms began about 4-5 days ago, but seemed to progress last night. He has also noted increased urinary frequency and increased nocturia. He had nocturia X 5-6 last night. He reports that his did have some abdominal pain this morning, which subsided after a few hours. He denies dysuria, gross hematuria, flank pain, fever, chills, nausea, or vomiting.   08/01/18: Todd Dawson returns today in f/u. He had right ESWL on 04/18/18 and initially passed fragments but he has had 3 4mm stones in the right distal ureter since f/u imaging in late August with the most recent films on 07/18/18. He has no pain, hematuria or voiding complaints. He has not seen the stones pass but he sees some tiny particles on occasion. UA is clear today.       The problem is on the right side. He first stated noticing pain on 04/13/2018. This is not his first kidney stone. His first stone was approximately 05/02/1998. He has had 1 stones prior to getting this one. He is not currently having flank pain, back pain, groin pain, nausea, vomiting, fever or chills. He has caught a stone in his urine strainer since his symptoms began.   He has had eswl for treatment of his stones in the past.     ALLERGIES: flomax - Headache, Nausea    MEDICATIONS: Atenolol 50 mg tablet  B Complex  Vitamin C  Vitamin D3     GU PSH: ESWL - 04/18/2018    NON-GU PSH: None   GU PMH: Ureteral calculus, The 3 ureteral stones have not changed. I discussed URS and ESWL and will get him set up for URS. I have reviewed the risks of ureteroscopy including bleeding, infection, ureteral injury, need for a stent or secondary procedures, thrombotic events and anesthetic  complications. - 08/01/2018, - 07/18/2018 Renal calculus - 07/18/2018, - 06/20/2018, - 05/23/2018, - 05/02/2018, He has 2 8mm right proximal stones at the level of the UPJ/proximal ureter with no progression since the CT on 7/13. I discussed the options with him including ESWL and URS. I don't think the stone is likely to pass. He would like ESWL. I reviewed the risks of ESWL including bleeding, infection, injury to the kidney or adjacent structures, failure to fragment the stone, need for ancillary procedures, thrombotic events, cardiac arrhythmias and sedation complications. I have refilled the percocet. He will continue to try to get a UA since we don't have one yet. , - 04/15/2018    NON-GU PMH: None   FAMILY HISTORY: 2 sons - No Family History heart failure - Father, Mother   SOCIAL HISTORY: Marital Status: Married Preferred Language: English; Ethnicity: Not Hispanic Or Latino; Race: White Current Smoking Status: Patient has never smoked.   Tobacco Use Assessment Completed: Used Tobacco in last 30 days? Has never drank.  Drinks 2 caffeinated drinks per day. Has not had a blood transfusion.    REVIEW OF SYSTEMS:    GU Review Male:   Patient reports get up at night to urinate. Patient denies frequent urination, hard to postpone urination, burning/ pain with urination, leakage of urine, stream starts and stops, trouble starting your stream, have to strain to urinate , erection problems, and penile  pain.  Gastrointestinal (Upper):   Patient denies nausea, vomiting, and indigestion/ heartburn.  Gastrointestinal (Lower):   Patient denies diarrhea and constipation.  Constitutional:   Patient denies fever, night sweats, weight loss, and fatigue.  Skin:   Patient denies skin rash/ lesion and itching.  Eyes:   Patient denies blurred vision and double vision.  Ears/ Nose/ Throat:   Patient denies sore throat and sinus problems.  Hematologic/Lymphatic:   Patient denies swollen glands and easy  bruising.  Cardiovascular:   Patient denies leg swelling and chest pains.  Respiratory:   Patient denies cough and shortness of breath.  Endocrine:   Patient denies excessive thirst.  Musculoskeletal:   Patient denies back pain and joint pain.  Neurological:   Patient denies dizziness and headaches.  Psychologic:   Patient denies depression and anxiety.   VITAL SIGNS:      08/15/2018 12:39 PM  Weight 174 lb / 78.93 kg  Height 71 in / 180.34 cm  BP 104/62 mmHg  Temperature 97.4 F / 36.3 C  BMI 24.3 kg/m   MULTI-SYSTEM PHYSICAL EXAMINATION:    Constitutional: Well-nourished. No physical deformities. Normally developed. Good grooming.  Respiratory: Normal breath sounds. No labored breathing, no use of accessory muscles.   Cardiovascular: Regular rate and rhythm. No murmur, no gallop.   Neurologic / Psychiatric: Oriented to time, oriented to place, oriented to person. No depression, no anxiety, no agitation.  Gastrointestinal: No mass, no tenderness, no rigidity, non obese abdomen.     PAST DATA REVIEWED:  Source Of History:  Patient  Records Review:   Previous Patient Records  Urine Test Review:   Urinalysis, Urine Culture  X-Ray Review: KUB: Reviewed Films.  C.T. Abdomen/Pelvis: Reviewed Films. Reviewed Report.     PROCEDURES:         KUB - [id number]  A single view of the abdomen is obtained. Bilateral renal shadows are not well visualized today due to overlying bowel gas. However, there continue to be 3 approximatly 4 mm opacity in the vicinity of right UVJ. These appear stable. Stable pelvic phleboilth on the left.               PVR Ultrasound - [id number]  Scanned Volume: 55 cc         Urinalysis Dipstick Dipstick Cont'd  Color: Yellow Bilirubin: Neg mg/dL  Appearance: Clear Ketones: Neg mg/dL  Specific Gravity: 0.1021.020 Blood: Neg ery/uL  pH: <=5.0 Protein: Neg mg/dL  Glucose: Neg mg/dL Urobilinogen: 0.2 mg/dL    Nitrites: Neg    Leukocyte Esterase: Neg leu/uL     ASSESSMENT:      ICD-10 Details  1 GU:   Ureteral calculus - N20.1    PLAN:           Orders Labs Urine Culture          Document Letter(s):  Created for Patient: Clinical Summary         Notes:   Urinalysis today is without infectious parameters. I will send for culture. PVR is reassuring at 55 cc. On KUB imaging today, bilateral renal shadows are not well visualized today due to overlying bowel gas. However, there continues to be 3 approximatly 4 mm opacity in the vicinity of right UVJ. These appear stable. Stable pelvic phleboilth on the left. We discussed the use of alpha blocker therapy, but he is unable to tolerate these due to side effects. I encouraged that he continue to hydrate well, but limit intake in the evenings prior  to bed. I offered Rx for pain today, but he declines as he has left over Rx at home that he can use is pain is progressive. He will keep follow up for surgery on 11/21. Return precuations reviewed in the interim.         Next Appointment:      Next Appointment: 08/22/2018 07:30 AM    Appointment Type: Surgery     Location: [facility] [phone]    Provider: Bjorn Pippin, M.D.    Reason for Visit: OP NE RT URS POSS LASER STENT

## 2018-08-21 NOTE — Anesthesia Preprocedure Evaluation (Addendum)
Anesthesia Evaluation  Patient identified by MRN, date of birth, ID band Patient awake    Reviewed: Allergy & Precautions, NPO status , Patient's Chart, lab work & pertinent test results  History of Anesthesia Complications Negative for: history of anesthetic complications  Airway Mallampati: II  TM Distance: >3 FB Neck ROM: Full    Dental  (+) Dental Advisory Given, Teeth Intact   Pulmonary neg pulmonary ROS,    breath sounds clear to auscultation       Cardiovascular negative cardio ROS   Rhythm:Regular Rate:Normal     Neuro/Psych negative neurological ROS  negative psych ROS   GI/Hepatic negative GI ROS, Neg liver ROS,   Endo/Other  negative endocrine ROS  Renal/GU  Hx kidney stones      Musculoskeletal negative musculoskeletal ROS (+)   Abdominal   Peds  Hematology negative hematology ROS (+)   Anesthesia Other Findings   Reproductive/Obstetrics                            Anesthesia Physical Anesthesia Plan  ASA: I  Anesthesia Plan: General   Post-op Pain Management:    Induction: Intravenous  PONV Risk Score and Plan: 2 and Treatment may vary due to age or medical condition, Ondansetron and Dexamethasone  Airway Management Planned: LMA  Additional Equipment: None  Intra-op Plan:   Post-operative Plan: Extubation in OR  Informed Consent: I have reviewed the patients History and Physical, chart, labs and discussed the procedure including the risks, benefits and alternatives for the proposed anesthesia with the patient or authorized representative who has indicated his/her understanding and acceptance.   Dental advisory given  Plan Discussed with: CRNA and Anesthesiologist  Anesthesia Plan Comments:        Anesthesia Quick Evaluation

## 2018-08-22 ENCOUNTER — Ambulatory Visit (HOSPITAL_BASED_OUTPATIENT_CLINIC_OR_DEPARTMENT_OTHER): Payer: Medicare HMO | Admitting: Anesthesiology

## 2018-08-22 ENCOUNTER — Encounter (HOSPITAL_BASED_OUTPATIENT_CLINIC_OR_DEPARTMENT_OTHER)
Admission: RE | Disposition: A | Discharge status: Home / Self Care | Payer: Medicare HMO | Source: Ambulatory Visit | Attending: Urology

## 2018-08-22 ENCOUNTER — Other Ambulatory Visit: Payer: Self-pay

## 2018-08-22 ENCOUNTER — Encounter (HOSPITAL_BASED_OUTPATIENT_CLINIC_OR_DEPARTMENT_OTHER): Payer: Self-pay | Admitting: Emergency Medicine

## 2018-08-22 ENCOUNTER — Ambulatory Visit (HOSPITAL_BASED_OUTPATIENT_CLINIC_OR_DEPARTMENT_OTHER)
Admission: RE | Admit: 2018-08-22 | Discharge: 2018-08-22 | Disposition: A | Discharge status: Home / Self Care | Payer: Medicare HMO | Source: Ambulatory Visit | Attending: Urology | Admitting: Urology

## 2018-08-22 DIAGNOSIS — Z87442 Personal history of urinary calculi: Secondary | ICD-10-CM | POA: Insufficient documentation

## 2018-08-22 DIAGNOSIS — N201 Calculus of ureter: Secondary | ICD-10-CM | POA: Diagnosis not present

## 2018-08-22 DIAGNOSIS — Z79899 Other long term (current) drug therapy: Secondary | ICD-10-CM | POA: Diagnosis not present

## 2018-08-22 HISTORY — DX: Allergy, unspecified, initial encounter: T78.40XA

## 2018-08-22 HISTORY — PX: HOLMIUM LASER APPLICATION: SHX5852

## 2018-08-22 HISTORY — PX: CYSTOSCOPY/URETEROSCOPY/HOLMIUM LASER/STENT PLACEMENT: SHX6546

## 2018-08-22 HISTORY — DX: Calculus of ureter: N20.1

## 2018-08-22 HISTORY — DX: Personal history of urinary calculi: Z87.442

## 2018-08-22 HISTORY — DX: Essential tremor: G25.0

## 2018-08-22 LAB — POCT I-STAT 4, (NA,K, GLUC, HGB,HCT)
Glucose, Bld: 102 mg/dL — ABNORMAL HIGH (ref 70–99)
HCT: 40 % (ref 39.0–52.0)
Hemoglobin: 13.6 g/dL (ref 13.0–17.0)
Potassium: 4 mmol/L (ref 3.5–5.1)
Sodium: 142 mmol/L (ref 135–145)

## 2018-08-22 SURGERY — CYSTOSCOPY/URETEROSCOPY/HOLMIUM LASER/STENT PLACEMENT
Anesthesia: General | Site: Renal | Laterality: Right

## 2018-08-22 MED ORDER — DEXAMETHASONE SODIUM PHOSPHATE 10 MG/ML IJ SOLN
INTRAMUSCULAR | Status: AC
  Filled 2018-08-22: qty 1

## 2018-08-22 MED ORDER — SODIUM CHLORIDE 0.9% FLUSH
3.0000 mL | Freq: Two times a day (BID) | INTRAVENOUS | Status: DC
  Filled 2018-08-22: qty 3

## 2018-08-22 MED ORDER — ONDANSETRON HCL 4 MG/2ML IJ SOLN
INTRAMUSCULAR | Status: AC
  Filled 2018-08-22: qty 2

## 2018-08-22 MED ORDER — KETOROLAC TROMETHAMINE 30 MG/ML IJ SOLN
INTRAMUSCULAR | Status: AC
  Filled 2018-08-22: qty 1

## 2018-08-22 MED ORDER — OXYCODONE HCL 5 MG/5ML PO SOLN
5.0000 mg | Freq: Once | ORAL | Status: DC | PRN
  Filled 2018-08-22: qty 5

## 2018-08-22 MED ORDER — OXYCODONE HCL 5 MG PO TABS
5.0000 mg | ORAL_TABLET | Freq: Once | ORAL | Status: DC | PRN
  Filled 2018-08-22: qty 1

## 2018-08-22 MED ORDER — PROPOFOL 10 MG/ML IV BOLUS
INTRAVENOUS | Status: AC
  Filled 2018-08-22: qty 40

## 2018-08-22 MED ORDER — ONDANSETRON HCL 4 MG/2ML IJ SOLN
4.0000 mg | Freq: Once | INTRAMUSCULAR | Status: DC | PRN
  Filled 2018-08-22: qty 2

## 2018-08-22 MED ORDER — OXYCODONE HCL 5 MG PO TABS
5.0000 mg | ORAL_TABLET | ORAL | Status: DC | PRN
  Filled 2018-08-22: qty 2

## 2018-08-22 MED ORDER — PROMETHAZINE HCL 25 MG/ML IJ SOLN
6.2500 mg | Freq: Four times a day (QID) | INTRAMUSCULAR | Status: DC | PRN
  Administered 2018-08-22: 6.25 mg via INTRAVENOUS
  Filled 2018-08-22: qty 1

## 2018-08-22 MED ORDER — MIDAZOLAM HCL 5 MG/5ML IJ SOLN
INTRAMUSCULAR | Status: DC | PRN
  Administered 2018-08-22: 2 mg via INTRAVENOUS

## 2018-08-22 MED ORDER — ACETAMINOPHEN 325 MG PO TABS
650.0000 mg | ORAL_TABLET | ORAL | Status: DC | PRN
  Filled 2018-08-22: qty 2

## 2018-08-22 MED ORDER — SODIUM CHLORIDE 0.9 % IR SOLN
Status: DC | PRN
  Administered 2018-08-22: 3000 mL

## 2018-08-22 MED ORDER — CEFAZOLIN SODIUM-DEXTROSE 2-4 GM/100ML-% IV SOLN
INTRAVENOUS | Status: AC
  Filled 2018-08-22: qty 100

## 2018-08-22 MED ORDER — FENTANYL CITRATE (PF) 100 MCG/2ML IJ SOLN
INTRAMUSCULAR | Status: DC | PRN
  Administered 2018-08-22 (×2): 25 ug via INTRAVENOUS

## 2018-08-22 MED ORDER — PROPOFOL 10 MG/ML IV BOLUS
INTRAVENOUS | Status: DC | PRN
  Administered 2018-08-22: 180 mg via INTRAVENOUS

## 2018-08-22 MED ORDER — SODIUM CHLORIDE 0.9 % IV SOLN
250.0000 mL | INTRAVENOUS | Status: DC | PRN
  Filled 2018-08-22: qty 250

## 2018-08-22 MED ORDER — LACTATED RINGERS IV SOLN
INTRAVENOUS | Status: DC
  Administered 2018-08-22 (×2): via INTRAVENOUS
  Filled 2018-08-22: qty 1000

## 2018-08-22 MED ORDER — MIDAZOLAM HCL 2 MG/2ML IJ SOLN
INTRAMUSCULAR | Status: AC
  Filled 2018-08-22: qty 2

## 2018-08-22 MED ORDER — CEFAZOLIN SODIUM-DEXTROSE 2-4 GM/100ML-% IV SOLN
2.0000 g | INTRAVENOUS | Status: AC
  Administered 2018-08-22: 2 g via INTRAVENOUS
  Filled 2018-08-22: qty 100

## 2018-08-22 MED ORDER — FENTANYL CITRATE (PF) 100 MCG/2ML IJ SOLN
INTRAMUSCULAR | Status: AC
  Filled 2018-08-22: qty 2

## 2018-08-22 MED ORDER — SODIUM CHLORIDE 0.9% FLUSH
3.0000 mL | INTRAVENOUS | Status: DC | PRN
  Filled 2018-08-22: qty 3

## 2018-08-22 MED ORDER — PHENAZOPYRIDINE HCL 200 MG PO TABS: 200.0000 mg | ORAL_TABLET | Freq: Three times a day (TID) | ORAL | 0 refills | Status: AC | PRN

## 2018-08-22 MED ORDER — MORPHINE SULFATE (PF) 2 MG/ML IV SOLN
2.0000 mg | INTRAVENOUS | Status: DC | PRN
  Filled 2018-08-22: qty 1

## 2018-08-22 MED ORDER — OXYCODONE-ACETAMINOPHEN 5-325 MG PO TABS: 1.0000 | ORAL_TABLET | Freq: Four times a day (QID) | ORAL | 0 refills | Status: AC | PRN

## 2018-08-22 MED ORDER — DEXAMETHASONE SODIUM PHOSPHATE 10 MG/ML IJ SOLN
INTRAMUSCULAR | Status: DC | PRN
  Administered 2018-08-22: 10 mg via INTRAVENOUS

## 2018-08-22 MED ORDER — FENTANYL CITRATE (PF) 100 MCG/2ML IJ SOLN
25.0000 ug | INTRAMUSCULAR | Status: DC | PRN
  Filled 2018-08-22: qty 1

## 2018-08-22 MED ORDER — ACETAMINOPHEN 650 MG RE SUPP
650.0000 mg | RECTAL | Status: DC | PRN
  Filled 2018-08-22: qty 1

## 2018-08-22 MED ORDER — ONDANSETRON HCL 4 MG/2ML IJ SOLN
INTRAMUSCULAR | Status: DC | PRN
  Administered 2018-08-22: 4 mg via INTRAVENOUS

## 2018-08-22 MED ORDER — IOHEXOL 300 MG/ML  SOLN
INTRAMUSCULAR | Status: DC | PRN
  Administered 2018-08-22: 10 mL

## 2018-08-22 MED ORDER — KETOROLAC TROMETHAMINE 30 MG/ML IJ SOLN
INTRAMUSCULAR | Status: DC | PRN
  Administered 2018-08-22: 15 mg via INTRAVENOUS

## 2018-08-22 MED ORDER — LIDOCAINE 2% (20 MG/ML) 5 ML SYRINGE
INTRAMUSCULAR | Status: AC
  Filled 2018-08-22: qty 5

## 2018-08-22 MED ORDER — PROMETHAZINE HCL 25 MG/ML IJ SOLN
INTRAMUSCULAR | Status: AC
  Filled 2018-08-22: qty 1

## 2018-08-22 MED ORDER — LIDOCAINE 2% (20 MG/ML) 5 ML SYRINGE
INTRAMUSCULAR | Status: DC | PRN
  Administered 2018-08-22: 80 mg via INTRAVENOUS

## 2018-08-22 SURGICAL SUPPLY — 30 items
BAG DRAIN URO-CYSTO SKYTR STRL (DRAIN) ×2 IMPLANT
BASKET STONE 1.7 NGAGE (UROLOGICAL SUPPLIES) ×4 IMPLANT
BASKET ZERO TIP NITINOL 2.4FR (BASKET) IMPLANT
CATH URET 5FR 28IN CONE TIP (BALLOONS)
CATH URET 5FR 28IN OPEN ENDED (CATHETERS) ×2 IMPLANT
CATH URET 5FR 70CM CONE TIP (BALLOONS) IMPLANT
CLOTH BEACON ORANGE TIMEOUT ST (SAFETY) ×2 IMPLANT
DRSG TEGADERM 2-3/8X2-3/4 SM (GAUZE/BANDAGES/DRESSINGS) ×2 IMPLANT
ELECT REM PT RETURN 9FT ADLT (ELECTROSURGICAL)
ELECTRODE REM PT RTRN 9FT ADLT (ELECTROSURGICAL) IMPLANT
FIBER LASER FLEXIVA 365 (UROLOGICAL SUPPLIES) ×2 IMPLANT
FIBER LASER TRAC TIP (UROLOGICAL SUPPLIES) IMPLANT
GLOVE BIO SURGEON STRL SZ 6.5 (GLOVE) ×4 IMPLANT
GLOVE BIOGEL PI IND STRL 6.5 (GLOVE) ×1 IMPLANT
GLOVE BIOGEL PI INDICATOR 6.5 (GLOVE) ×1
GLOVE SURG SS PI 8.0 STRL IVOR (GLOVE) ×2 IMPLANT
GOWN STRL REUS W/TWL XL LVL3 (GOWN DISPOSABLE) ×2 IMPLANT
GUIDEWIRE ANG ZIPWIRE 038X150 (WIRE) IMPLANT
GUIDEWIRE STR DUAL SENSOR (WIRE) ×2 IMPLANT
INFUSOR MANOMETER BAG 3000ML (MISCELLANEOUS) ×2 IMPLANT
IV NS IRRIG 3000ML ARTHROMATIC (IV SOLUTION) ×2 IMPLANT
KIT BALLN UROMAX 15FX4 (MISCELLANEOUS) ×1 IMPLANT
KIT BALLN UROMAX 26 75X4 (MISCELLANEOUS) ×1
KIT TURNOVER CYSTO (KITS) ×2 IMPLANT
MANIFOLD NEPTUNE II (INSTRUMENTS) ×2 IMPLANT
NS IRRIG 500ML POUR BTL (IV SOLUTION) ×2 IMPLANT
PACK CYSTO (CUSTOM PROCEDURE TRAY) ×2 IMPLANT
STENT URET 6FRX26 CONTOUR (STENTS) ×2 IMPLANT
TUBE CONNECTING 12X1/4 (SUCTIONS) IMPLANT
TUBING UROLOGY SET (TUBING) IMPLANT

## 2018-08-22 NOTE — Op Note (Signed)
Procedure: 1.  Cystoscopy with right ureteroscopic stone extraction. 2.  Cystoscopy with left retrograde pyelogram and interpretation. 3.  Left ureteroscopy with holmium laser application, stone removal and insertion of double-J stent.  Preop diagnosis: Right distal ureteral fragments from prior lithotripsy.  Postop diagnosis: Right distal ureteral fragments from prior lithotripsy and nonobstructing left distal ureteral stone.  Surgeon: Dr. Irine Seal.  Anesthesia: General.  Drains: 6 French by 26 cm left contour double-J stent with tether.  Specimen: Stones from the left and right distal ureters.  EBL: Minimal.  Complications: None.  Indications: Todd Dawson is a 72 year old white male who previously undergone lithotripsy for right UPJ stone.  He has had persistent right distal ureteral fragments and is felt to require ureteroscopy for removal.  Procedure: He was given 2 g of Ancef and taken to the operating room where general anesthetic was induced.  He was placed in lithotomy position and fitted with PAS hose.  His perineum and genitalia were prepped with Betadine solution and he was draped in usual sterile fashion.  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  Examination of bladder revealed a smooth wall without tumor stones or inflammation.  Ureteral orifices were in their normal anatomic position.  On the right a stone was visible through the meatus.  A guidewire was then passed through the cystoscope up the right ureteral orifice to the kidney under fluoroscopic guidance.  The cystoscope was removed and a 35 cm digital access sheath inner core was advanced over the wire to dilate the ureteral meatus.  A 6.5 French semirigid ureteroscope was then advanced alongside the wire and the  stone fragments were removed with an engage basket.  Initially I only identified 2 stone fragments in the distal ureter despite the prior KUB showing 3 but the third calcification was eventually found in the bladder.  The scope was advanced to the mid ureter with no additional stones seen in fluoroscopy and spot imaging views showed no additional right ureteral or renal stones.  There was approximately a 6 mm calcification in the left pelvis which on prior imaging was felt to be a phlebolith, but I was concerned that it might be a nonobstructing ureteral calculus based on the position relative to the other stones that were removed on the right.  A left retrograde pyelogram was then performed using a 5 French opening catheter and contrast.  The left retrograde pyelogram revealed a filling defect in the distal ureter associated with the calcification confirming the presence of a nonobstructing ureteral stone.  That calcification is been present for the last 4 months it was felt that removal was indicated.  A guidewire was then passed through the opening catheter to the kidney and the opening catheter was removed.  A 4 cm x 15 French high-pressure balloon was then passed over the wire across ureteral meatus to the level of the stone.  The balloon was dilated to 20 atm under fluoroscopic guidance.  The balloon was then removed leaving the wire in place.  The ureteroscope was then advanced alongside the wire and a mulberry shaped stone was identified as expected.  An initial attempt to remove the stone intact found that it was too large to remove without fragmentation.  A 365 m holmium laser fiber was passed with the initial settings of  0.5 J and 10 Hz with a subsequent increase in power to 0.8 J and the stone was fragmented into manageable fragments.  Those fragments were then removed with an engage basket.  Below the level of the stone in the ureter there was some mild stricturing that is been disrupted by the  balloon, so it was felt that a ureteral stent was indicated.  The cystoscope was then reinserted over the wire and a 6 [place] by 26 cm contour double-J stent with tether was passed to the kidney under fluoroscopic guidance.  The wire was removed leaving a good coil in the kidney and a good coil in the bladder.  The cystoscope was then removed carefully to avoid pulling the tether and then reinserted alongside the tether and the bladder was inspected.  Some residual stone material in the base the bladder was evacuated and the cystoscope was removed.  He was taken down from lithotomy position after the stent tether been secured to the penis.  His anesthetic was reversed and he was moved to recovery in stable condition.  There were no complications.

## 2018-08-22 NOTE — Anesthesia Procedure Notes (Signed)
Procedure Name: LMA Insertion Date/Time: 08/22/2018 7:27 AM Performed by: Briant Sitesenenny, Kamile Fassler T, CRNA Pre-anesthesia Checklist: Patient identified, Emergency Drugs available, Suction available and Patient being monitored Patient Re-evaluated:Patient Re-evaluated prior to induction Oxygen Delivery Method: Circle system utilized Preoxygenation: Pre-oxygenation with 100% oxygen Induction Type: IV induction Ventilation: Mask ventilation without difficulty LMA: LMA inserted LMA Size: 5.0 Number of attempts: 1 Airway Equipment and Method: Bite block Placement Confirmation: positive ETCO2 Tube secured with: Tape Dental Injury: Teeth and Oropharynx as per pre-operative assessment and Injury to lip  Comments: Small scratch to R upper lip noted after LMA placement

## 2018-08-22 NOTE — Discharge Instructions (Addendum)
Ureteral Stent Implantation, Care After Refer to this sheet in the next few weeks. These instructions provide you with information about caring for yourself after your procedure. Your health care provider may also give you more specific instructions. Your treatment has been planned according to current medical practices, but problems sometimes occur. Call your health care provider if you have any problems or questions after your procedure. What can I expect after the procedure? After the procedure, it is common to have:  Nausea.  Mild pain when you urinate. You may feel this pain in your lower back or lower abdomen. Pain should stop within a few minutes after you urinate. This may last for up to 1 week.  A small amount of blood in your urine for several days.  Follow these instructions at home:  Medicines  Take over-the-counter and prescription medicines only as told by your health care provider.  If you were prescribed an antibiotic medicine, take it as told by your health care provider. Do not stop taking the antibiotic even if you start to feel better.  Do not drive for 24 hours if you received a sedative.  Do not drive or operate heavy machinery while taking prescription pain medicines. Activity  Return to your normal activities as told by your health care provider. Ask your health care provider what activities are safe for you.  Do not lift anything that is heavier than 10 lb (4.5 kg). Follow this limit for 1 week after your procedure, or for as long as told by your health care provider. General instructions  Watch for any blood in your urine. Call your health care provider if the amount of blood in your urine increases.  If you have a catheter: ? Follow instructions from your health care provider about taking care of your catheter and collection bag. ? Do not take baths, swim, or use a hot tub until your health care provider approves.  Drink enough fluid to keep your urine  clear or pale yellow.  Keep all follow-up visits as told by your health care provider. This is important. Contact a health care provider if:  You have pain that gets worse or does not get better with medicine, especially pain when you urinate.  You have difficulty urinating.  You feel nauseous or you vomit repeatedly during a period of more than 2 days after the procedure. Get help right away if:  Your urine is dark red or has blood clots in it.  You are leaking urine (have incontinence).  The end of the stent comes out of your urethra.  You cannot urinate.  You have sudden, sharp, or severe pain in your abdomen or lower back.  You have a fever.  You may remove the stent by pulling the attached string on Monday morning.  If you don't feel comfortable doing that, please contact the office to come in for removal.  You had not only fragments in the right lower ureter but there was also a stone in the left lower ureter that appears to have been there for a long time.  The stent is on the left because there was more narrowing below the stone on the left than the right.   This information is not intended to replace advice given to you by your health care provider. Make sure you discuss any questions you have with your health care provider. Document Released: 05/21/2013 Document Revised: 02/24/2016 Document Reviewed: 04/02/2015 Elsevier Interactive Patient Education  2018 [employer]  NO  ADVIL, ALEVE, MOTRIN, IBUPROFEN UNTIL 1:30 PM TODAY   Post Anesthesia Home Care Instructions  Activity: Get plenty of rest for the remainder of the day. A responsible adult should stay with you for 24 hours following the procedure.  For the next 24 hours, DO NOT: -Drive a car -Advertising copywriter -Drink alcoholic beverages -Take any medication unless instructed by your physician -Make any legal decisions or sign important papers.  Meals: Start with liquid foods such as gelatin or soup. Progress  to regular foods as tolerated. Avoid greasy, spicy, heavy foods. If nausea and/or vomiting occur, drink only clear liquids until the nausea and/or vomiting subsides. Call your physician if vomiting continues.  Special Instructions/Symptoms: Your throat may feel dry or sore from the anesthesia or the breathing tube placed in your throat during surgery. If this causes discomfort, gargle with warm salt water. The discomfort should disappear within 24 hours.  If you had a scopolamine patch placed behind your ear for the management of post- operative nausea and/or vomiting:  1. The medication in the patch is effective for 72 hours, after which it should be removed.  Wrap patch in a tissue and discard in the trash. Wash hands thoroughly with soap and water. 2. You may remove the patch earlier than 72 hours if you experience unpleasant side effects which may include dry mouth, dizziness or visual disturbances. 3. Avoid touching the patch. Wash your hands with soap and water after contact with the patch.

## 2018-08-22 NOTE — Interval H&P Note (Signed)
History and Physical Interval Note:  08/22/2018 7:20 AM  Todd Dawson  has presented today for surgery, with the diagnosis of RIGHT URETERAL STONE  The various methods of treatment have been discussed with the patient and family. After consideration of risks, benefits and other options for treatment, the patient has consented to  Procedure(s): RIGHT URETEROSCOPY/HOLMIUM LASER/STENT PLACEMENT (Right) as a surgical intervention .  The patient's history has been reviewed, patient examined, no change in status, stable for surgery.  I have reviewed the patient's chart and labs.  Questions were answered to the patient's satisfaction.     [name]

## 2018-08-22 NOTE — Anesthesia Postprocedure Evaluation (Signed)
Anesthesia Post Note  Patient: Mckinley JewelJames A Linan  Procedure(s) Performed: RIGHT URETEROSCOPY,RETROGRADE, BALLOON DILATION, STONE BASKETRY, STENT PLACEMENT (Right Renal) HOLMIUM LASER APPLICATION (Right Renal)     Patient location during evaluation: PACU Anesthesia Type: General Level of consciousness: awake and alert Pain management: pain level controlled Vital Signs Assessment: post-procedure vital signs reviewed and stable Respiratory status: spontaneous breathing, nonlabored ventilation and respiratory function stable Cardiovascular status: blood pressure returned to baseline and stable Postop Assessment: no apparent nausea or vomiting Anesthetic complications: no    Last Vitals:  Vitals:   08/22/18 0945 08/22/18 1022  BP: (!) 152/88 (!) 149/82  Pulse: (!) 37 (!) 46  Resp: 11 16  Temp:  36.5 C  SpO2: 100% 100%    Last Pain:  Vitals:   08/22/18 1022  TempSrc:   PainSc: 0-No pain                 Beryle Lathehomas E Brock

## 2018-08-22 NOTE — Transfer of Care (Signed)
Immediate Anesthesia Transfer of Care Note  Patient: Todd Dawson  Procedure(s) Performed: RIGHT URETEROSCOPY,RETROGRADE, BALLOON DILATION, STONE BASKETRY, STENT PLACEMENT (Right Renal) HOLMIUM LASER APPLICATION (Right Renal)  Patient Location: PACU  Anesthesia Type:General  Level of Consciousness: sedated and responds to stimulation  Airway & Oxygen Therapy: Patient Spontanous Breathing and Patient connected to nasal cannula oxygen  Post-op Assessment: Report given to RN  Post vital signs: Reviewed and stable  Last Vitals:  Vitals Value Taken Time  BP 130/75 08/22/2018  8:27 AM  Temp 36.3 C 08/22/2018  8:27 AM  Pulse 37 08/22/2018  8:29 AM  Resp 10 08/22/2018  8:29 AM  SpO2 99 % 08/22/2018  8:29 AM  Vitals shown include unvalidated device data.  Last Pain:  Vitals:   08/22/18 0540  TempSrc: Oral      Patients Stated Pain Goal: 5 (08/22/18 0540)  Complications: No apparent anesthesia complications

## 2018-08-22 NOTE — Interval H&P Note (Signed)
History and Physical Interval Note:  He has not passed his stones.  08/22/2018 7:18 AM  Todd Dawson  has presented today for surgery, with the diagnosis of RIGHT URETERAL STONE  The various methods of treatment have been discussed with the patient and family. After consideration of risks, benefits and other options for treatment, the patient has consented to  Procedure(s): RIGHT URETEROSCOPY/HOLMIUM LASER/STENT PLACEMENT (Right) as a surgical intervention .  The patient's history has been reviewed, patient examined, no change in status, stable for surgery.  I have reviewed the patient's chart and labs.  Questions were answered to the patient's satisfaction.     Todd PippinJohn Donna Dawson

## 2018-08-23 ENCOUNTER — Encounter (HOSPITAL_BASED_OUTPATIENT_CLINIC_OR_DEPARTMENT_OTHER): Payer: Self-pay | Admitting: Urology

## 2018-08-26 DIAGNOSIS — J301 Allergic rhinitis due to pollen: Secondary | ICD-10-CM | POA: Diagnosis not present

## 2018-08-26 DIAGNOSIS — N201 Calculus of ureter: Secondary | ICD-10-CM | POA: Diagnosis not present

## 2018-09-03 DIAGNOSIS — R05 Cough: Secondary | ICD-10-CM | POA: Diagnosis not present

## 2018-09-03 DIAGNOSIS — J3081 Allergic rhinitis due to animal (cat) (dog) hair and dander: Secondary | ICD-10-CM | POA: Diagnosis not present

## 2018-09-03 DIAGNOSIS — Z91010 Allergy to peanuts: Secondary | ICD-10-CM | POA: Diagnosis not present

## 2018-09-03 DIAGNOSIS — J3089 Other allergic rhinitis: Secondary | ICD-10-CM | POA: Diagnosis not present

## 2018-09-03 DIAGNOSIS — J301 Allergic rhinitis due to pollen: Secondary | ICD-10-CM | POA: Diagnosis not present

## 2018-09-04 DIAGNOSIS — N200 Calculus of kidney: Secondary | ICD-10-CM | POA: Diagnosis not present

## 2018-09-23 DIAGNOSIS — J301 Allergic rhinitis due to pollen: Secondary | ICD-10-CM | POA: Diagnosis not present

## 2018-10-17 DIAGNOSIS — N200 Calculus of kidney: Secondary | ICD-10-CM | POA: Diagnosis not present

## 2018-10-23 DIAGNOSIS — J301 Allergic rhinitis due to pollen: Secondary | ICD-10-CM | POA: Diagnosis not present

## 2018-11-22 DIAGNOSIS — J301 Allergic rhinitis due to pollen: Secondary | ICD-10-CM | POA: Diagnosis not present

## 2018-11-25 DIAGNOSIS — J301 Allergic rhinitis due to pollen: Secondary | ICD-10-CM | POA: Diagnosis not present

## 2018-11-27 DIAGNOSIS — J301 Allergic rhinitis due to pollen: Secondary | ICD-10-CM | POA: Diagnosis not present

## 2018-12-03 DIAGNOSIS — J301 Allergic rhinitis due to pollen: Secondary | ICD-10-CM | POA: Diagnosis not present

## 2018-12-05 DIAGNOSIS — J301 Allergic rhinitis due to pollen: Secondary | ICD-10-CM | POA: Diagnosis not present

## 2018-12-31 DIAGNOSIS — J301 Allergic rhinitis due to pollen: Secondary | ICD-10-CM | POA: Diagnosis not present

## 2019-01-27 DIAGNOSIS — J301 Allergic rhinitis due to pollen: Secondary | ICD-10-CM | POA: Diagnosis not present

## 2019-01-30 DIAGNOSIS — Z0000 Encounter for general adult medical examination without abnormal findings: Secondary | ICD-10-CM | POA: Diagnosis not present

## 2019-01-30 DIAGNOSIS — Z1389 Encounter for screening for other disorder: Secondary | ICD-10-CM | POA: Diagnosis not present

## 2019-01-30 DIAGNOSIS — G250 Essential tremor: Secondary | ICD-10-CM | POA: Diagnosis not present

## 2019-02-25 DIAGNOSIS — J301 Allergic rhinitis due to pollen: Secondary | ICD-10-CM | POA: Diagnosis not present

## 2019-03-03 DIAGNOSIS — M79671 Pain in right foot: Secondary | ICD-10-CM | POA: Diagnosis not present

## 2019-03-03 DIAGNOSIS — M7741 Metatarsalgia, right foot: Secondary | ICD-10-CM | POA: Diagnosis not present

## 2019-03-03 DIAGNOSIS — M7742 Metatarsalgia, left foot: Secondary | ICD-10-CM | POA: Diagnosis not present

## 2019-03-03 DIAGNOSIS — M79672 Pain in left foot: Secondary | ICD-10-CM | POA: Diagnosis not present

## 2019-03-11 DIAGNOSIS — M7741 Metatarsalgia, right foot: Secondary | ICD-10-CM | POA: Diagnosis not present

## 2019-03-11 DIAGNOSIS — M7742 Metatarsalgia, left foot: Secondary | ICD-10-CM | POA: Diagnosis not present

## 2019-03-18 DIAGNOSIS — M7741 Metatarsalgia, right foot: Secondary | ICD-10-CM | POA: Diagnosis not present

## 2019-03-18 DIAGNOSIS — M7742 Metatarsalgia, left foot: Secondary | ICD-10-CM | POA: Diagnosis not present

## 2019-03-26 DIAGNOSIS — M7742 Metatarsalgia, left foot: Secondary | ICD-10-CM | POA: Diagnosis not present

## 2019-03-26 DIAGNOSIS — M7741 Metatarsalgia, right foot: Secondary | ICD-10-CM | POA: Diagnosis not present

## 2019-04-01 DIAGNOSIS — J301 Allergic rhinitis due to pollen: Secondary | ICD-10-CM | POA: Diagnosis not present

## 2019-04-09 DIAGNOSIS — N200 Calculus of kidney: Secondary | ICD-10-CM | POA: Diagnosis not present

## 2019-04-09 DIAGNOSIS — Z87442 Personal history of urinary calculi: Secondary | ICD-10-CM | POA: Diagnosis not present

## 2019-04-17 ENCOUNTER — Other Ambulatory Visit: Payer: Self-pay | Admitting: Internal Medicine

## 2019-04-17 DIAGNOSIS — Z20822 Contact with and (suspected) exposure to covid-19: Secondary | ICD-10-CM

## 2019-04-22 LAB — NOVEL CORONAVIRUS, NAA: SARS-CoV-2, NAA: NOT DETECTED

## 2019-04-28 DIAGNOSIS — J301 Allergic rhinitis due to pollen: Secondary | ICD-10-CM | POA: Diagnosis not present

## 2019-05-26 DIAGNOSIS — J301 Allergic rhinitis due to pollen: Secondary | ICD-10-CM | POA: Diagnosis not present

## 2019-06-13 ENCOUNTER — Other Ambulatory Visit: Payer: Self-pay | Admitting: *Deleted

## 2019-06-13 DIAGNOSIS — Z20822 Contact with and (suspected) exposure to covid-19: Secondary | ICD-10-CM

## 2019-06-13 DIAGNOSIS — R6889 Other general symptoms and signs: Secondary | ICD-10-CM | POA: Diagnosis not present

## 2019-06-15 LAB — NOVEL CORONAVIRUS, NAA: SARS-CoV-2, NAA: NOT DETECTED

## 2019-06-18 ENCOUNTER — Telehealth: Payer: Self-pay | Admitting: General Practice

## 2019-06-18 NOTE — Telephone Encounter (Signed)
Negative COVID results given. Patient results "NOT Detected." Caller expressed understanding. ° °

## 2019-06-23 DIAGNOSIS — J301 Allergic rhinitis due to pollen: Secondary | ICD-10-CM | POA: Diagnosis not present

## 2019-07-18 ENCOUNTER — Other Ambulatory Visit: Payer: Self-pay

## 2019-07-18 DIAGNOSIS — Z20828 Contact with and (suspected) exposure to other viral communicable diseases: Secondary | ICD-10-CM | POA: Diagnosis not present

## 2019-07-18 DIAGNOSIS — Z20822 Contact with and (suspected) exposure to covid-19: Secondary | ICD-10-CM

## 2019-07-20 LAB — NOVEL CORONAVIRUS, NAA: SARS-CoV-2, NAA: NOT DETECTED

## 2019-07-21 DIAGNOSIS — J301 Allergic rhinitis due to pollen: Secondary | ICD-10-CM | POA: Diagnosis not present

## 2019-08-03 IMAGING — CT CT RENAL STONE PROTOCOL
2 of 4 series · 17 of 46 positions shown, 19 images · non-contrast
Comparison: 07/07/2013

CLINICAL DATA: Right upper quadrant pain.

EXAM:
CT ABDOMEN AND PELVIS WITHOUT CONTRAST
TECHNIQUE: Multidetector CT imaging of the abdomen and pelvis was performed
following the standard protocol without IV contrast.

[Series 3: stone study 5.0 i30f 2 · axial · 0.84mm/px · z∈[+707,+1152]mm · 14 of 97 slices shown, 16 images]
[im 4/97  soft-tissue]
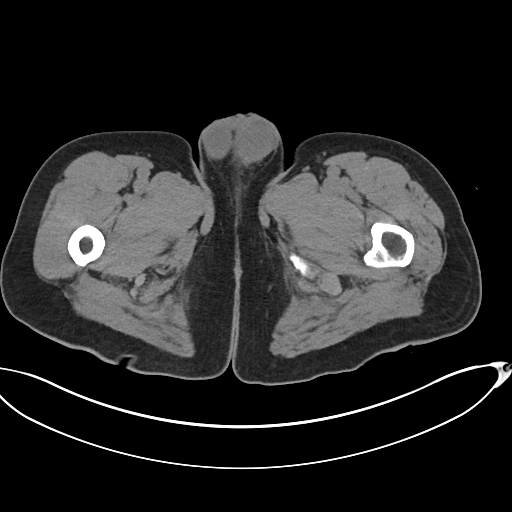
[im 4/97  bone]
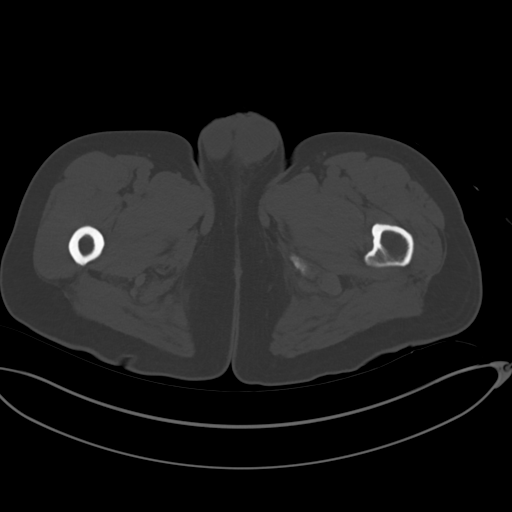
[im 12/97  soft-tissue]
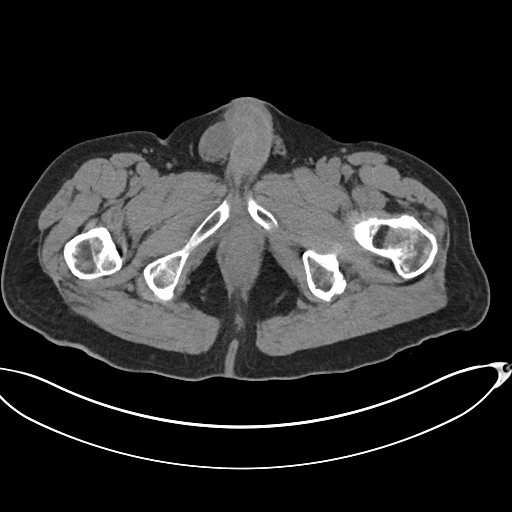
[im 20/97  soft-tissue]
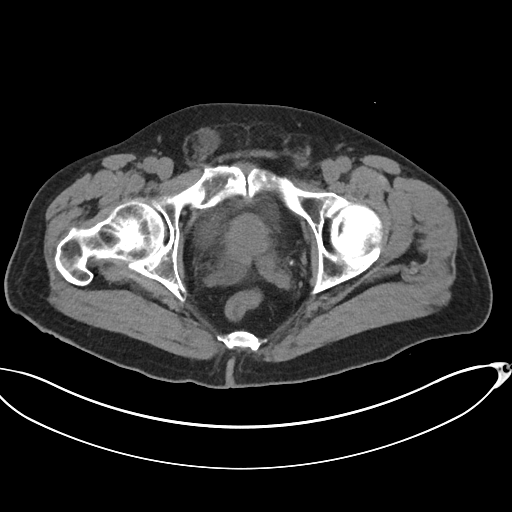
[im 27/97  soft-tissue]
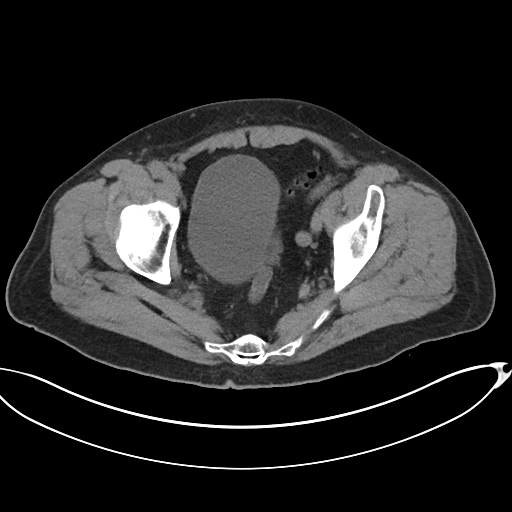
[im 31/97  soft-tissue]
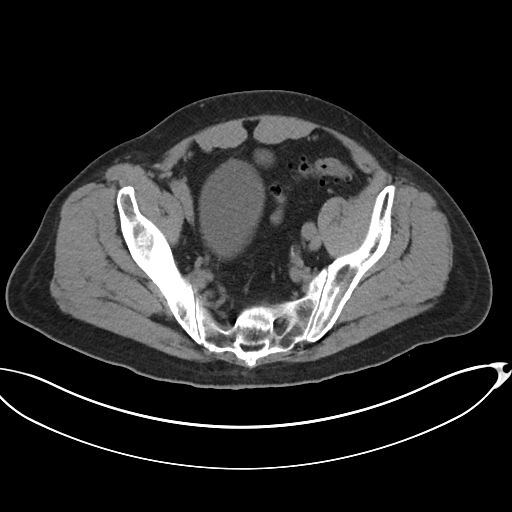
[im 39/97  soft-tissue]
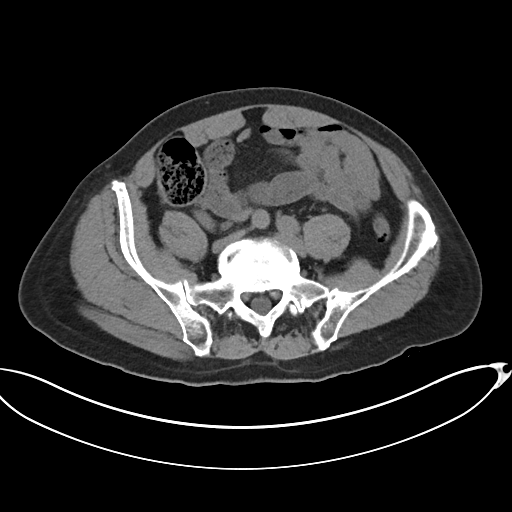
[im 47/97  soft-tissue]
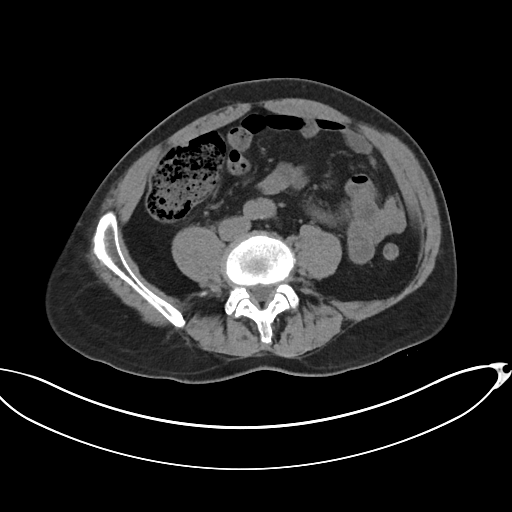
[im 50/97  soft-tissue]
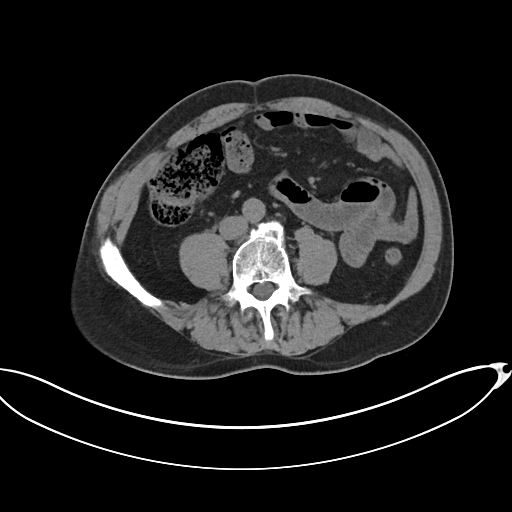
[im 58/97  soft-tissue]
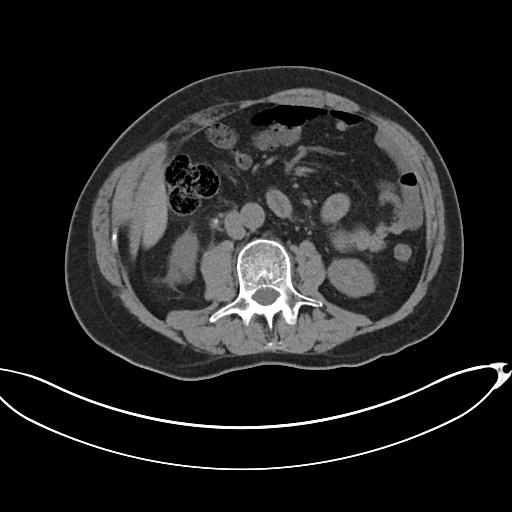
[im 58/97  bone]
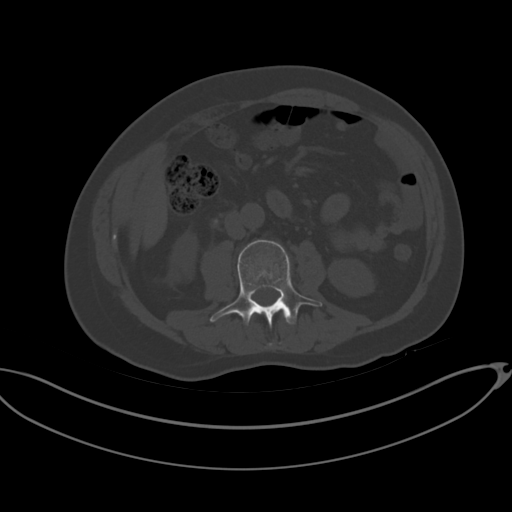
[im 66/97  soft-tissue]
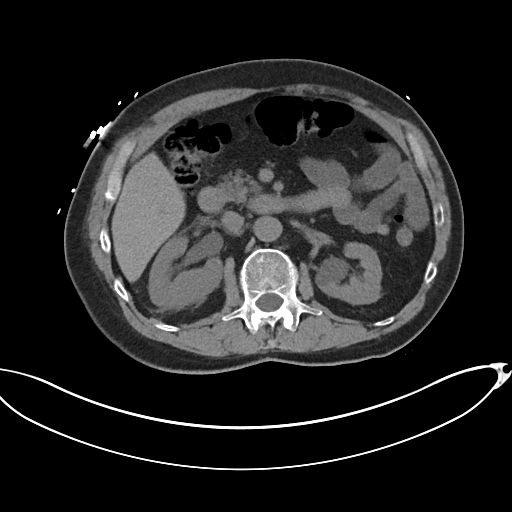
[im 73/97  soft-tissue]
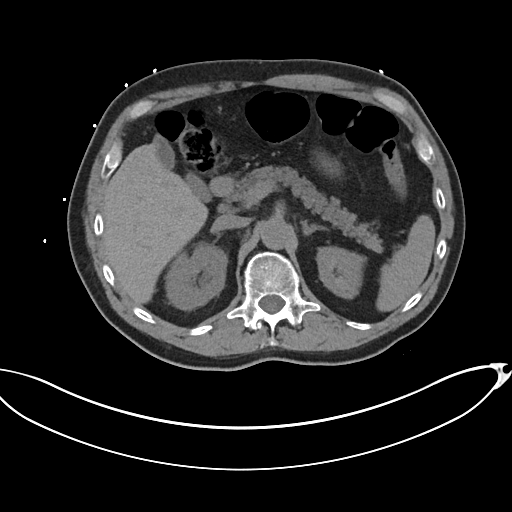
[im 77/97  soft-tissue]
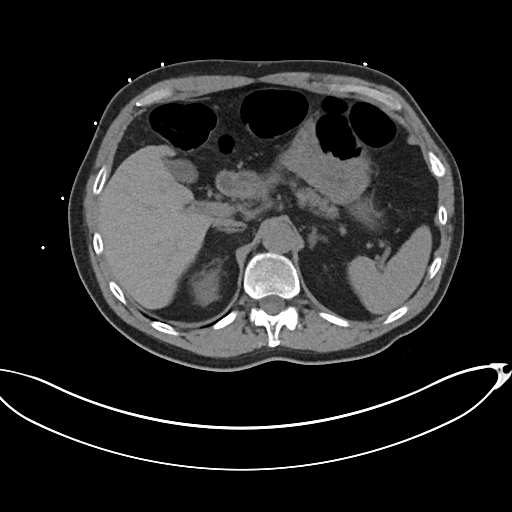
[im 85/97  soft-tissue]
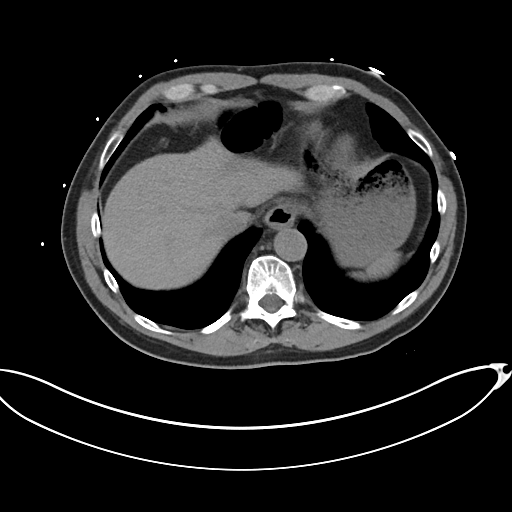
[im 93/97  soft-tissue]
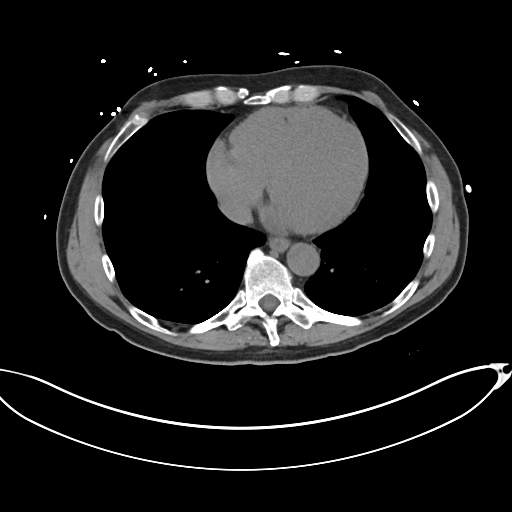

[Series 6: coronal soft tissue · coronal · 0.87mm/px · 3 of 116 slices shown]
[im 39/116  soft-tissue]
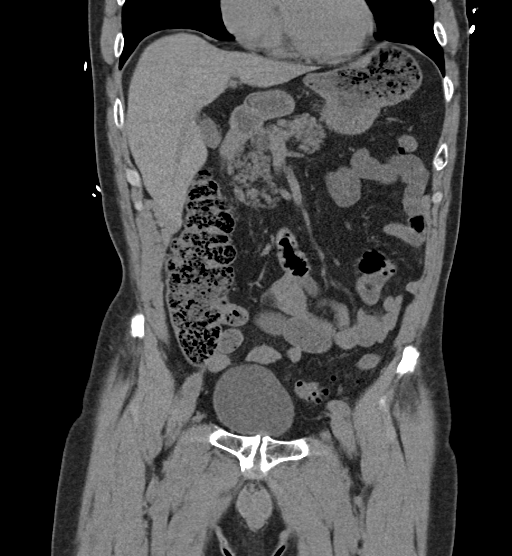
[im 52/116  soft-tissue]
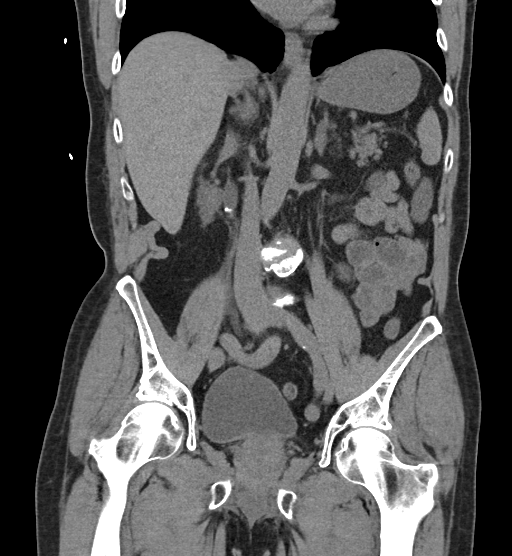
[im 64/116  soft-tissue]
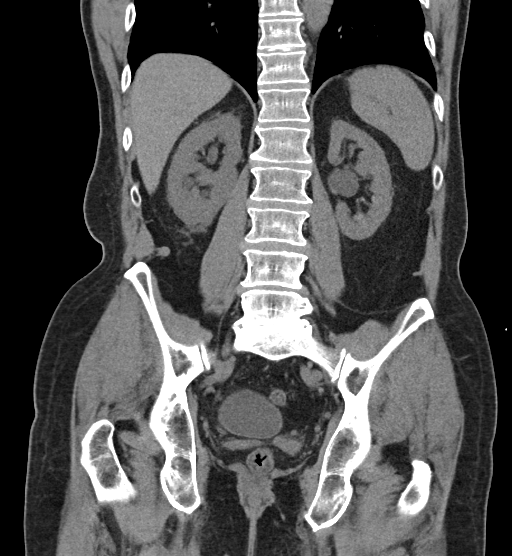

[17 of 46 positions shown; findings below may reference images not displayed]

FINDINGS: Lower chest: Normal

Hepatobiliary: No focal hepatic parenchymal abnormality. Somewhat
diminutive left lobe. No calcified gallstones.

Pancreas: Normal

Spleen: Normal

Adrenals/Urinary Tract: Adrenal glands are normal. Left kidney is
normal except for a 2 cm cyst in the midportion. The right kidney
shows hydronephrosis and surrounding edema. Two stones previously
seen in the lower pole caliceal system are now present at the right
UPJ responsible for the renal obstruction. Each of these stones
measures about 7 8 mm in diameter. No stones seen more distally
within the ureter or within the bladder. 2 mm nonobstructing stone
in the midportion of the right kidney.

Stomach/Bowel: Sigmoid diverticulosis without evidence of
diverticulitis. No other bowel finding of note.

Vascular/Lymphatic: Aorta and IVC are normal. No retroperitoneal
adenopathy.

Reproductive: Right testicle appears to be present within the
inguinal canal. Possible associated epididymal cyst.

Other: No free fluid or air.

Musculoskeletal: Lower lumbar degenerative changes.
IMPRESSION: Hydronephrosis on the right due 2 at least 2 stones impacted at the
right UPJ. These appear to be about 8 mm in size each.

Right testicle present within the inguinal canal. Cyst proximal to
the testicle presumed to be an epididymal cyst.

## 2019-08-18 DIAGNOSIS — J3089 Other allergic rhinitis: Secondary | ICD-10-CM | POA: Diagnosis not present

## 2019-08-18 DIAGNOSIS — J301 Allergic rhinitis due to pollen: Secondary | ICD-10-CM | POA: Diagnosis not present

## 2019-08-26 DIAGNOSIS — T6591XA Toxic effect of unspecified substance, accidental (unintentional), initial encounter: Secondary | ICD-10-CM | POA: Diagnosis not present

## 2019-08-26 DIAGNOSIS — L509 Urticaria, unspecified: Secondary | ICD-10-CM | POA: Diagnosis not present

## 2019-09-05 DIAGNOSIS — J3081 Allergic rhinitis due to animal (cat) (dog) hair and dander: Secondary | ICD-10-CM | POA: Diagnosis not present

## 2019-09-05 DIAGNOSIS — J3089 Other allergic rhinitis: Secondary | ICD-10-CM | POA: Diagnosis not present

## 2019-09-05 DIAGNOSIS — J301 Allergic rhinitis due to pollen: Secondary | ICD-10-CM | POA: Diagnosis not present

## 2019-09-05 DIAGNOSIS — Z91010 Allergy to peanuts: Secondary | ICD-10-CM | POA: Diagnosis not present

## 2019-09-15 DIAGNOSIS — J301 Allergic rhinitis due to pollen: Secondary | ICD-10-CM | POA: Diagnosis not present

## 2019-10-01 DIAGNOSIS — R69 Illness, unspecified: Secondary | ICD-10-CM | POA: Diagnosis not present

## 2019-10-13 DIAGNOSIS — J301 Allergic rhinitis due to pollen: Secondary | ICD-10-CM | POA: Diagnosis not present

## 2019-11-04 DIAGNOSIS — J301 Allergic rhinitis due to pollen: Secondary | ICD-10-CM | POA: Diagnosis not present

## 2019-11-10 DIAGNOSIS — J3089 Other allergic rhinitis: Secondary | ICD-10-CM | POA: Diagnosis not present

## 2019-11-10 DIAGNOSIS — J301 Allergic rhinitis due to pollen: Secondary | ICD-10-CM | POA: Diagnosis not present

## 2019-12-02 DIAGNOSIS — H5213 Myopia, bilateral: Secondary | ICD-10-CM | POA: Diagnosis not present

## 2019-12-02 DIAGNOSIS — Z0100 Encounter for examination of eyes and vision without abnormal findings: Secondary | ICD-10-CM | POA: Diagnosis not present

## 2019-12-02 DIAGNOSIS — H25013 Cortical age-related cataract, bilateral: Secondary | ICD-10-CM | POA: Diagnosis not present

## 2019-12-02 DIAGNOSIS — H52203 Unspecified astigmatism, bilateral: Secondary | ICD-10-CM | POA: Diagnosis not present

## 2019-12-08 DIAGNOSIS — J301 Allergic rhinitis due to pollen: Secondary | ICD-10-CM | POA: Diagnosis not present

## 2020-01-05 DIAGNOSIS — J301 Allergic rhinitis due to pollen: Secondary | ICD-10-CM | POA: Diagnosis not present

## 2020-01-05 DIAGNOSIS — M79645 Pain in left finger(s): Secondary | ICD-10-CM | POA: Diagnosis not present

## 2020-01-05 DIAGNOSIS — M65312 Trigger thumb, left thumb: Secondary | ICD-10-CM | POA: Diagnosis not present

## 2020-01-07 DIAGNOSIS — J301 Allergic rhinitis due to pollen: Secondary | ICD-10-CM | POA: Diagnosis not present

## 2020-01-12 DIAGNOSIS — J301 Allergic rhinitis due to pollen: Secondary | ICD-10-CM | POA: Diagnosis not present

## 2020-01-14 DIAGNOSIS — J3089 Other allergic rhinitis: Secondary | ICD-10-CM | POA: Diagnosis not present

## 2020-01-14 DIAGNOSIS — J301 Allergic rhinitis due to pollen: Secondary | ICD-10-CM | POA: Diagnosis not present

## 2020-01-19 DIAGNOSIS — J301 Allergic rhinitis due to pollen: Secondary | ICD-10-CM | POA: Diagnosis not present

## 2020-02-02 DIAGNOSIS — Z1389 Encounter for screening for other disorder: Secondary | ICD-10-CM | POA: Diagnosis not present

## 2020-02-02 DIAGNOSIS — R972 Elevated prostate specific antigen [PSA]: Secondary | ICD-10-CM | POA: Diagnosis not present

## 2020-02-02 DIAGNOSIS — Z13220 Encounter for screening for lipoid disorders: Secondary | ICD-10-CM | POA: Diagnosis not present

## 2020-02-02 DIAGNOSIS — Z125 Encounter for screening for malignant neoplasm of prostate: Secondary | ICD-10-CM | POA: Diagnosis not present

## 2020-02-02 DIAGNOSIS — Z131 Encounter for screening for diabetes mellitus: Secondary | ICD-10-CM | POA: Diagnosis not present

## 2020-02-02 DIAGNOSIS — G250 Essential tremor: Secondary | ICD-10-CM | POA: Diagnosis not present

## 2020-02-02 DIAGNOSIS — Z0000 Encounter for general adult medical examination without abnormal findings: Secondary | ICD-10-CM | POA: Diagnosis not present

## 2020-02-02 DIAGNOSIS — E785 Hyperlipidemia, unspecified: Secondary | ICD-10-CM | POA: Diagnosis not present

## 2020-02-16 DIAGNOSIS — J301 Allergic rhinitis due to pollen: Secondary | ICD-10-CM | POA: Diagnosis not present

## 2020-02-18 DIAGNOSIS — M7672 Peroneal tendinitis, left leg: Secondary | ICD-10-CM | POA: Diagnosis not present

## 2020-02-18 DIAGNOSIS — M79672 Pain in left foot: Secondary | ICD-10-CM | POA: Diagnosis not present

## 2020-02-20 DIAGNOSIS — M65312 Trigger thumb, left thumb: Secondary | ICD-10-CM | POA: Diagnosis not present

## 2020-03-15 DIAGNOSIS — J301 Allergic rhinitis due to pollen: Secondary | ICD-10-CM | POA: Diagnosis not present

## 2020-04-02 DIAGNOSIS — M65312 Trigger thumb, left thumb: Secondary | ICD-10-CM | POA: Diagnosis not present

## 2020-04-12 DIAGNOSIS — J301 Allergic rhinitis due to pollen: Secondary | ICD-10-CM | POA: Diagnosis not present

## 2020-04-12 DIAGNOSIS — J3089 Other allergic rhinitis: Secondary | ICD-10-CM | POA: Diagnosis not present

## 2020-04-12 DIAGNOSIS — R351 Nocturia: Secondary | ICD-10-CM | POA: Diagnosis not present

## 2020-04-12 DIAGNOSIS — Z87442 Personal history of urinary calculi: Secondary | ICD-10-CM | POA: Diagnosis not present

## 2020-05-10 DIAGNOSIS — J301 Allergic rhinitis due to pollen: Secondary | ICD-10-CM | POA: Diagnosis not present

## 2020-05-11 DIAGNOSIS — M65312 Trigger thumb, left thumb: Secondary | ICD-10-CM | POA: Diagnosis not present

## 2020-05-11 DIAGNOSIS — M13841 Other specified arthritis, right hand: Secondary | ICD-10-CM | POA: Diagnosis not present

## 2020-05-31 DIAGNOSIS — R69 Illness, unspecified: Secondary | ICD-10-CM | POA: Diagnosis not present

## 2020-06-03 DIAGNOSIS — Z20822 Contact with and (suspected) exposure to covid-19: Secondary | ICD-10-CM | POA: Diagnosis not present

## 2020-06-03 DIAGNOSIS — M7592 Shoulder lesion, unspecified, left shoulder: Secondary | ICD-10-CM | POA: Diagnosis not present

## 2020-06-14 DIAGNOSIS — J301 Allergic rhinitis due to pollen: Secondary | ICD-10-CM | POA: Diagnosis not present

## 2020-06-14 DIAGNOSIS — J3089 Other allergic rhinitis: Secondary | ICD-10-CM | POA: Diagnosis not present

## 2020-07-12 DIAGNOSIS — R69 Illness, unspecified: Secondary | ICD-10-CM | POA: Diagnosis not present

## 2020-07-12 DIAGNOSIS — J3089 Other allergic rhinitis: Secondary | ICD-10-CM | POA: Diagnosis not present

## 2020-07-12 DIAGNOSIS — J301 Allergic rhinitis due to pollen: Secondary | ICD-10-CM | POA: Diagnosis not present

## 2020-07-23 DIAGNOSIS — L918 Other hypertrophic disorders of the skin: Secondary | ICD-10-CM | POA: Diagnosis not present

## 2020-07-23 DIAGNOSIS — Z23 Encounter for immunization: Secondary | ICD-10-CM | POA: Diagnosis not present

## 2020-07-26 DIAGNOSIS — R69 Illness, unspecified: Secondary | ICD-10-CM | POA: Diagnosis not present

## 2020-08-09 DIAGNOSIS — J3081 Allergic rhinitis due to animal (cat) (dog) hair and dander: Secondary | ICD-10-CM | POA: Diagnosis not present

## 2020-08-09 DIAGNOSIS — J301 Allergic rhinitis due to pollen: Secondary | ICD-10-CM | POA: Diagnosis not present

## 2020-08-09 DIAGNOSIS — J3089 Other allergic rhinitis: Secondary | ICD-10-CM | POA: Diagnosis not present

## 2020-08-10 DIAGNOSIS — R972 Elevated prostate specific antigen [PSA]: Secondary | ICD-10-CM | POA: Diagnosis not present

## 2020-09-06 DIAGNOSIS — J301 Allergic rhinitis due to pollen: Secondary | ICD-10-CM | POA: Diagnosis not present

## 2020-09-06 DIAGNOSIS — J3089 Other allergic rhinitis: Secondary | ICD-10-CM | POA: Diagnosis not present

## 2020-09-14 DIAGNOSIS — J3089 Other allergic rhinitis: Secondary | ICD-10-CM | POA: Diagnosis not present

## 2020-09-14 DIAGNOSIS — J3081 Allergic rhinitis due to animal (cat) (dog) hair and dander: Secondary | ICD-10-CM | POA: Diagnosis not present

## 2020-09-14 DIAGNOSIS — Z91010 Allergy to peanuts: Secondary | ICD-10-CM | POA: Diagnosis not present

## 2020-09-14 DIAGNOSIS — J301 Allergic rhinitis due to pollen: Secondary | ICD-10-CM | POA: Diagnosis not present

## 2020-09-20 DIAGNOSIS — G250 Essential tremor: Secondary | ICD-10-CM | POA: Diagnosis not present

## 2020-09-20 DIAGNOSIS — R682 Dry mouth, unspecified: Secondary | ICD-10-CM | POA: Diagnosis not present

## 2020-10-15 DIAGNOSIS — J301 Allergic rhinitis due to pollen: Secondary | ICD-10-CM | POA: Diagnosis not present

## 2020-11-09 DIAGNOSIS — J301 Allergic rhinitis due to pollen: Secondary | ICD-10-CM | POA: Diagnosis not present

## 2020-11-20 DIAGNOSIS — J301 Allergic rhinitis due to pollen: Secondary | ICD-10-CM | POA: Diagnosis not present

## 2020-12-07 DIAGNOSIS — J301 Allergic rhinitis due to pollen: Secondary | ICD-10-CM | POA: Diagnosis not present

## 2021-01-03 DIAGNOSIS — J301 Allergic rhinitis due to pollen: Secondary | ICD-10-CM | POA: Diagnosis not present

## 2021-01-06 DIAGNOSIS — M13841 Other specified arthritis, right hand: Secondary | ICD-10-CM | POA: Diagnosis not present

## 2021-01-06 DIAGNOSIS — M65311 Trigger thumb, right thumb: Secondary | ICD-10-CM | POA: Diagnosis not present

## 2021-01-18 DIAGNOSIS — H524 Presbyopia: Secondary | ICD-10-CM | POA: Diagnosis not present

## 2021-01-18 DIAGNOSIS — H25013 Cortical age-related cataract, bilateral: Secondary | ICD-10-CM | POA: Diagnosis not present

## 2021-02-01 DIAGNOSIS — J301 Allergic rhinitis due to pollen: Secondary | ICD-10-CM | POA: Diagnosis not present

## 2021-02-03 DIAGNOSIS — Z1389 Encounter for screening for other disorder: Secondary | ICD-10-CM | POA: Diagnosis not present

## 2021-02-03 DIAGNOSIS — G250 Essential tremor: Secondary | ICD-10-CM | POA: Diagnosis not present

## 2021-02-03 DIAGNOSIS — Z125 Encounter for screening for malignant neoplasm of prostate: Secondary | ICD-10-CM | POA: Diagnosis not present

## 2021-02-03 DIAGNOSIS — Z1331 Encounter for screening for depression: Secondary | ICD-10-CM | POA: Diagnosis not present

## 2021-02-21 DIAGNOSIS — U071 COVID-19: Secondary | ICD-10-CM | POA: Diagnosis not present

## 2021-02-21 DIAGNOSIS — R0989 Other specified symptoms and signs involving the circulatory and respiratory systems: Secondary | ICD-10-CM | POA: Diagnosis not present

## 2021-02-21 DIAGNOSIS — R059 Cough, unspecified: Secondary | ICD-10-CM | POA: Diagnosis not present

## 2021-02-21 DIAGNOSIS — R52 Pain, unspecified: Secondary | ICD-10-CM | POA: Diagnosis not present

## 2021-03-01 DIAGNOSIS — U071 COVID-19: Secondary | ICD-10-CM | POA: Diagnosis not present

## 2021-03-02 DIAGNOSIS — J3089 Other allergic rhinitis: Secondary | ICD-10-CM | POA: Diagnosis not present

## 2021-03-02 DIAGNOSIS — J301 Allergic rhinitis due to pollen: Secondary | ICD-10-CM | POA: Diagnosis not present

## 2021-03-28 DIAGNOSIS — J301 Allergic rhinitis due to pollen: Secondary | ICD-10-CM | POA: Diagnosis not present

## 2021-04-25 DIAGNOSIS — J301 Allergic rhinitis due to pollen: Secondary | ICD-10-CM | POA: Diagnosis not present

## 2021-04-27 DIAGNOSIS — J301 Allergic rhinitis due to pollen: Secondary | ICD-10-CM | POA: Diagnosis not present

## 2021-05-02 DIAGNOSIS — J301 Allergic rhinitis due to pollen: Secondary | ICD-10-CM | POA: Diagnosis not present

## 2021-05-04 DIAGNOSIS — J301 Allergic rhinitis due to pollen: Secondary | ICD-10-CM | POA: Diagnosis not present

## 2021-05-09 DIAGNOSIS — J301 Allergic rhinitis due to pollen: Secondary | ICD-10-CM | POA: Diagnosis not present

## 2021-05-23 DIAGNOSIS — H43811 Vitreous degeneration, right eye: Secondary | ICD-10-CM | POA: Diagnosis not present

## 2021-06-07 DIAGNOSIS — H43811 Vitreous degeneration, right eye: Secondary | ICD-10-CM | POA: Diagnosis not present

## 2021-06-08 DIAGNOSIS — J301 Allergic rhinitis due to pollen: Secondary | ICD-10-CM | POA: Diagnosis not present

## 2021-07-04 DIAGNOSIS — J301 Allergic rhinitis due to pollen: Secondary | ICD-10-CM | POA: Diagnosis not present

## 2021-08-02 DIAGNOSIS — J301 Allergic rhinitis due to pollen: Secondary | ICD-10-CM | POA: Diagnosis not present

## 2021-08-29 DIAGNOSIS — J301 Allergic rhinitis due to pollen: Secondary | ICD-10-CM | POA: Diagnosis not present

## 2021-09-13 DIAGNOSIS — J3081 Allergic rhinitis due to animal (cat) (dog) hair and dander: Secondary | ICD-10-CM | POA: Diagnosis not present

## 2021-09-13 DIAGNOSIS — Z91010 Allergy to peanuts: Secondary | ICD-10-CM | POA: Diagnosis not present

## 2021-09-13 DIAGNOSIS — J3089 Other allergic rhinitis: Secondary | ICD-10-CM | POA: Diagnosis not present

## 2021-09-13 DIAGNOSIS — J301 Allergic rhinitis due to pollen: Secondary | ICD-10-CM | POA: Diagnosis not present

## 2021-09-28 DIAGNOSIS — J301 Allergic rhinitis due to pollen: Secondary | ICD-10-CM | POA: Diagnosis not present

## 2021-10-25 DIAGNOSIS — J301 Allergic rhinitis due to pollen: Secondary | ICD-10-CM | POA: Diagnosis not present

## 2021-11-21 DIAGNOSIS — J301 Allergic rhinitis due to pollen: Secondary | ICD-10-CM | POA: Diagnosis not present

## 2021-12-01 DIAGNOSIS — J301 Allergic rhinitis due to pollen: Secondary | ICD-10-CM | POA: Diagnosis not present

## 2021-12-20 DIAGNOSIS — J301 Allergic rhinitis due to pollen: Secondary | ICD-10-CM | POA: Diagnosis not present

## 2021-12-22 DIAGNOSIS — T63421D Toxic effect of venom of ants, accidental (unintentional), subsequent encounter: Secondary | ICD-10-CM | POA: Diagnosis not present

## 2021-12-22 DIAGNOSIS — J301 Allergic rhinitis due to pollen: Secondary | ICD-10-CM | POA: Diagnosis not present

## 2021-12-22 DIAGNOSIS — J3081 Allergic rhinitis due to animal (cat) (dog) hair and dander: Secondary | ICD-10-CM | POA: Diagnosis not present

## 2021-12-22 DIAGNOSIS — J3089 Other allergic rhinitis: Secondary | ICD-10-CM | POA: Diagnosis not present

## 2021-12-29 DIAGNOSIS — J3081 Allergic rhinitis due to animal (cat) (dog) hair and dander: Secondary | ICD-10-CM | POA: Diagnosis not present

## 2021-12-29 DIAGNOSIS — J3089 Other allergic rhinitis: Secondary | ICD-10-CM | POA: Diagnosis not present

## 2021-12-29 DIAGNOSIS — J301 Allergic rhinitis due to pollen: Secondary | ICD-10-CM | POA: Diagnosis not present

## 2022-01-19 DIAGNOSIS — H25013 Cortical age-related cataract, bilateral: Secondary | ICD-10-CM | POA: Diagnosis not present

## 2022-01-19 DIAGNOSIS — H43811 Vitreous degeneration, right eye: Secondary | ICD-10-CM | POA: Diagnosis not present

## 2022-01-19 DIAGNOSIS — H5213 Myopia, bilateral: Secondary | ICD-10-CM | POA: Diagnosis not present

## 2022-02-01 DIAGNOSIS — J301 Allergic rhinitis due to pollen: Secondary | ICD-10-CM | POA: Diagnosis not present

## 2022-02-21 DIAGNOSIS — J3089 Other allergic rhinitis: Secondary | ICD-10-CM | POA: Diagnosis not present

## 2022-02-21 DIAGNOSIS — J301 Allergic rhinitis due to pollen: Secondary | ICD-10-CM | POA: Diagnosis not present

## 2022-02-21 DIAGNOSIS — J3081 Allergic rhinitis due to animal (cat) (dog) hair and dander: Secondary | ICD-10-CM | POA: Diagnosis not present

## 2022-03-14 DIAGNOSIS — Z86010 Personal history of colonic polyps: Secondary | ICD-10-CM | POA: Diagnosis not present

## 2022-03-14 DIAGNOSIS — G250 Essential tremor: Secondary | ICD-10-CM | POA: Diagnosis not present

## 2022-03-14 DIAGNOSIS — Z0000 Encounter for general adult medical examination without abnormal findings: Secondary | ICD-10-CM | POA: Diagnosis not present

## 2022-03-14 DIAGNOSIS — Z125 Encounter for screening for malignant neoplasm of prostate: Secondary | ICD-10-CM | POA: Diagnosis not present

## 2022-03-14 DIAGNOSIS — Z1331 Encounter for screening for depression: Secondary | ICD-10-CM | POA: Diagnosis not present

## 2022-03-22 DIAGNOSIS — J301 Allergic rhinitis due to pollen: Secondary | ICD-10-CM | POA: Diagnosis not present

## 2022-03-22 DIAGNOSIS — J3081 Allergic rhinitis due to animal (cat) (dog) hair and dander: Secondary | ICD-10-CM | POA: Diagnosis not present

## 2022-03-22 DIAGNOSIS — J3089 Other allergic rhinitis: Secondary | ICD-10-CM | POA: Diagnosis not present

## 2022-03-23 DIAGNOSIS — J3081 Allergic rhinitis due to animal (cat) (dog) hair and dander: Secondary | ICD-10-CM | POA: Diagnosis not present

## 2022-03-23 DIAGNOSIS — J3089 Other allergic rhinitis: Secondary | ICD-10-CM | POA: Diagnosis not present

## 2022-03-23 DIAGNOSIS — J301 Allergic rhinitis due to pollen: Secondary | ICD-10-CM | POA: Diagnosis not present

## 2022-03-23 DIAGNOSIS — L821 Other seborrheic keratosis: Secondary | ICD-10-CM | POA: Diagnosis not present

## 2022-03-24 DIAGNOSIS — J3089 Other allergic rhinitis: Secondary | ICD-10-CM | POA: Diagnosis not present

## 2022-03-24 DIAGNOSIS — J3081 Allergic rhinitis due to animal (cat) (dog) hair and dander: Secondary | ICD-10-CM | POA: Diagnosis not present

## 2022-03-24 DIAGNOSIS — J301 Allergic rhinitis due to pollen: Secondary | ICD-10-CM | POA: Diagnosis not present

## 2022-04-18 DIAGNOSIS — J3089 Other allergic rhinitis: Secondary | ICD-10-CM | POA: Diagnosis not present

## 2022-04-18 DIAGNOSIS — J301 Allergic rhinitis due to pollen: Secondary | ICD-10-CM | POA: Diagnosis not present

## 2022-04-18 DIAGNOSIS — J3081 Allergic rhinitis due to animal (cat) (dog) hair and dander: Secondary | ICD-10-CM | POA: Diagnosis not present

## 2022-05-15 DIAGNOSIS — J3081 Allergic rhinitis due to animal (cat) (dog) hair and dander: Secondary | ICD-10-CM | POA: Diagnosis not present

## 2022-05-15 DIAGNOSIS — J3089 Other allergic rhinitis: Secondary | ICD-10-CM | POA: Diagnosis not present

## 2022-05-15 DIAGNOSIS — J301 Allergic rhinitis due to pollen: Secondary | ICD-10-CM | POA: Diagnosis not present

## 2022-06-14 DIAGNOSIS — J3089 Other allergic rhinitis: Secondary | ICD-10-CM | POA: Diagnosis not present

## 2022-06-14 DIAGNOSIS — J301 Allergic rhinitis due to pollen: Secondary | ICD-10-CM | POA: Diagnosis not present

## 2022-06-14 DIAGNOSIS — J3081 Allergic rhinitis due to animal (cat) (dog) hair and dander: Secondary | ICD-10-CM | POA: Diagnosis not present

## 2022-06-20 DIAGNOSIS — J3089 Other allergic rhinitis: Secondary | ICD-10-CM | POA: Diagnosis not present

## 2022-06-20 DIAGNOSIS — J301 Allergic rhinitis due to pollen: Secondary | ICD-10-CM | POA: Diagnosis not present

## 2022-06-20 DIAGNOSIS — J3081 Allergic rhinitis due to animal (cat) (dog) hair and dander: Secondary | ICD-10-CM | POA: Diagnosis not present

## 2022-07-12 DIAGNOSIS — J3081 Allergic rhinitis due to animal (cat) (dog) hair and dander: Secondary | ICD-10-CM | POA: Diagnosis not present

## 2022-08-09 DIAGNOSIS — J3089 Other allergic rhinitis: Secondary | ICD-10-CM | POA: Diagnosis not present

## 2022-08-09 DIAGNOSIS — J3081 Allergic rhinitis due to animal (cat) (dog) hair and dander: Secondary | ICD-10-CM | POA: Diagnosis not present

## 2022-08-09 DIAGNOSIS — J301 Allergic rhinitis due to pollen: Secondary | ICD-10-CM | POA: Diagnosis not present

## 2022-08-18 DIAGNOSIS — R051 Acute cough: Secondary | ICD-10-CM | POA: Diagnosis not present

## 2022-08-30 DIAGNOSIS — L821 Other seborrheic keratosis: Secondary | ICD-10-CM | POA: Diagnosis not present

## 2022-09-07 DIAGNOSIS — J301 Allergic rhinitis due to pollen: Secondary | ICD-10-CM | POA: Diagnosis not present

## 2022-09-14 DIAGNOSIS — Z91010 Allergy to peanuts: Secondary | ICD-10-CM | POA: Diagnosis not present

## 2022-09-14 DIAGNOSIS — J301 Allergic rhinitis due to pollen: Secondary | ICD-10-CM | POA: Diagnosis not present

## 2022-09-14 DIAGNOSIS — J3081 Allergic rhinitis due to animal (cat) (dog) hair and dander: Secondary | ICD-10-CM | POA: Diagnosis not present

## 2022-09-14 DIAGNOSIS — J3089 Other allergic rhinitis: Secondary | ICD-10-CM | POA: Diagnosis not present

## 2022-10-06 DIAGNOSIS — J3089 Other allergic rhinitis: Secondary | ICD-10-CM | POA: Diagnosis not present

## 2022-10-06 DIAGNOSIS — J301 Allergic rhinitis due to pollen: Secondary | ICD-10-CM | POA: Diagnosis not present

## 2022-10-06 DIAGNOSIS — J3081 Allergic rhinitis due to animal (cat) (dog) hair and dander: Secondary | ICD-10-CM | POA: Diagnosis not present

## 2022-11-06 DIAGNOSIS — J3081 Allergic rhinitis due to animal (cat) (dog) hair and dander: Secondary | ICD-10-CM | POA: Diagnosis not present

## 2022-11-06 DIAGNOSIS — J301 Allergic rhinitis due to pollen: Secondary | ICD-10-CM | POA: Diagnosis not present

## 2022-11-06 DIAGNOSIS — J3089 Other allergic rhinitis: Secondary | ICD-10-CM | POA: Diagnosis not present

## 2022-11-28 DIAGNOSIS — J301 Allergic rhinitis due to pollen: Secondary | ICD-10-CM | POA: Diagnosis not present

## 2022-11-28 DIAGNOSIS — J3089 Other allergic rhinitis: Secondary | ICD-10-CM | POA: Diagnosis not present

## 2022-11-28 DIAGNOSIS — J3081 Allergic rhinitis due to animal (cat) (dog) hair and dander: Secondary | ICD-10-CM | POA: Diagnosis not present

## 2022-12-01 DIAGNOSIS — U071 COVID-19: Secondary | ICD-10-CM | POA: Diagnosis not present

## 2022-12-01 DIAGNOSIS — B349 Viral infection, unspecified: Secondary | ICD-10-CM | POA: Diagnosis not present

## 2023-01-03 DIAGNOSIS — J301 Allergic rhinitis due to pollen: Secondary | ICD-10-CM | POA: Diagnosis not present

## 2023-01-03 DIAGNOSIS — J3081 Allergic rhinitis due to animal (cat) (dog) hair and dander: Secondary | ICD-10-CM | POA: Diagnosis not present

## 2023-01-03 DIAGNOSIS — J3089 Other allergic rhinitis: Secondary | ICD-10-CM | POA: Diagnosis not present

## 2023-01-24 DIAGNOSIS — H2513 Age-related nuclear cataract, bilateral: Secondary | ICD-10-CM | POA: Diagnosis not present

## 2023-01-24 DIAGNOSIS — H524 Presbyopia: Secondary | ICD-10-CM | POA: Diagnosis not present

## 2023-01-24 DIAGNOSIS — H43811 Vitreous degeneration, right eye: Secondary | ICD-10-CM | POA: Diagnosis not present

## 2023-01-24 DIAGNOSIS — H5213 Myopia, bilateral: Secondary | ICD-10-CM | POA: Diagnosis not present

## 2023-01-25 DIAGNOSIS — J3089 Other allergic rhinitis: Secondary | ICD-10-CM | POA: Diagnosis not present

## 2023-01-25 DIAGNOSIS — J301 Allergic rhinitis due to pollen: Secondary | ICD-10-CM | POA: Diagnosis not present

## 2023-01-25 DIAGNOSIS — J3081 Allergic rhinitis due to animal (cat) (dog) hair and dander: Secondary | ICD-10-CM | POA: Diagnosis not present

## 2023-01-30 DIAGNOSIS — J301 Allergic rhinitis due to pollen: Secondary | ICD-10-CM | POA: Diagnosis not present

## 2023-01-30 DIAGNOSIS — J3081 Allergic rhinitis due to animal (cat) (dog) hair and dander: Secondary | ICD-10-CM | POA: Diagnosis not present

## 2023-01-30 DIAGNOSIS — J3089 Other allergic rhinitis: Secondary | ICD-10-CM | POA: Diagnosis not present

## 2023-02-07 DIAGNOSIS — J3081 Allergic rhinitis due to animal (cat) (dog) hair and dander: Secondary | ICD-10-CM | POA: Diagnosis not present

## 2023-02-07 DIAGNOSIS — J3089 Other allergic rhinitis: Secondary | ICD-10-CM | POA: Diagnosis not present

## 2023-02-07 DIAGNOSIS — J301 Allergic rhinitis due to pollen: Secondary | ICD-10-CM | POA: Diagnosis not present

## 2023-02-13 DIAGNOSIS — J301 Allergic rhinitis due to pollen: Secondary | ICD-10-CM | POA: Diagnosis not present

## 2023-02-13 DIAGNOSIS — J3081 Allergic rhinitis due to animal (cat) (dog) hair and dander: Secondary | ICD-10-CM | POA: Diagnosis not present

## 2023-02-13 DIAGNOSIS — J3089 Other allergic rhinitis: Secondary | ICD-10-CM | POA: Diagnosis not present

## 2023-02-21 DIAGNOSIS — J3089 Other allergic rhinitis: Secondary | ICD-10-CM | POA: Diagnosis not present

## 2023-02-21 DIAGNOSIS — J301 Allergic rhinitis due to pollen: Secondary | ICD-10-CM | POA: Diagnosis not present

## 2023-02-28 DIAGNOSIS — J3089 Other allergic rhinitis: Secondary | ICD-10-CM | POA: Diagnosis not present

## 2023-02-28 DIAGNOSIS — J301 Allergic rhinitis due to pollen: Secondary | ICD-10-CM | POA: Diagnosis not present

## 2023-02-28 DIAGNOSIS — J3081 Allergic rhinitis due to animal (cat) (dog) hair and dander: Secondary | ICD-10-CM | POA: Diagnosis not present

## 2023-03-06 DIAGNOSIS — J301 Allergic rhinitis due to pollen: Secondary | ICD-10-CM | POA: Diagnosis not present

## 2023-03-13 DIAGNOSIS — J301 Allergic rhinitis due to pollen: Secondary | ICD-10-CM | POA: Diagnosis not present

## 2023-03-13 DIAGNOSIS — J3081 Allergic rhinitis due to animal (cat) (dog) hair and dander: Secondary | ICD-10-CM | POA: Diagnosis not present

## 2023-03-13 DIAGNOSIS — J3089 Other allergic rhinitis: Secondary | ICD-10-CM | POA: Diagnosis not present

## 2023-04-10 DIAGNOSIS — J301 Allergic rhinitis due to pollen: Secondary | ICD-10-CM | POA: Diagnosis not present

## 2023-04-10 DIAGNOSIS — J3089 Other allergic rhinitis: Secondary | ICD-10-CM | POA: Diagnosis not present

## 2023-04-17 DIAGNOSIS — J301 Allergic rhinitis due to pollen: Secondary | ICD-10-CM | POA: Diagnosis not present

## 2023-04-23 DIAGNOSIS — M7989 Other specified soft tissue disorders: Secondary | ICD-10-CM | POA: Diagnosis not present

## 2023-05-02 DIAGNOSIS — M7989 Other specified soft tissue disorders: Secondary | ICD-10-CM | POA: Diagnosis not present

## 2023-05-02 DIAGNOSIS — R600 Localized edema: Secondary | ICD-10-CM | POA: Diagnosis not present

## 2023-05-15 DIAGNOSIS — J301 Allergic rhinitis due to pollen: Secondary | ICD-10-CM | POA: Diagnosis not present

## 2023-05-22 DIAGNOSIS — J3089 Other allergic rhinitis: Secondary | ICD-10-CM | POA: Diagnosis not present

## 2023-05-22 DIAGNOSIS — J3081 Allergic rhinitis due to animal (cat) (dog) hair and dander: Secondary | ICD-10-CM | POA: Diagnosis not present

## 2023-05-22 DIAGNOSIS — J301 Allergic rhinitis due to pollen: Secondary | ICD-10-CM | POA: Diagnosis not present

## 2023-06-15 DIAGNOSIS — E7800 Pure hypercholesterolemia, unspecified: Secondary | ICD-10-CM | POA: Diagnosis not present

## 2023-06-15 DIAGNOSIS — Z0000 Encounter for general adult medical examination without abnormal findings: Secondary | ICD-10-CM | POA: Diagnosis not present

## 2023-06-15 DIAGNOSIS — Z1211 Encounter for screening for malignant neoplasm of colon: Secondary | ICD-10-CM | POA: Diagnosis not present

## 2023-06-15 DIAGNOSIS — G250 Essential tremor: Secondary | ICD-10-CM | POA: Diagnosis not present

## 2023-06-15 DIAGNOSIS — Z86010 Personal history of colonic polyps: Secondary | ICD-10-CM | POA: Diagnosis not present

## 2023-06-15 DIAGNOSIS — Z79899 Other long term (current) drug therapy: Secondary | ICD-10-CM | POA: Diagnosis not present

## 2023-06-15 DIAGNOSIS — Z1331 Encounter for screening for depression: Secondary | ICD-10-CM | POA: Diagnosis not present

## 2023-06-15 DIAGNOSIS — Z125 Encounter for screening for malignant neoplasm of prostate: Secondary | ICD-10-CM | POA: Diagnosis not present

## 2023-06-15 DIAGNOSIS — Z23 Encounter for immunization: Secondary | ICD-10-CM | POA: Diagnosis not present

## 2023-06-20 DIAGNOSIS — J3081 Allergic rhinitis due to animal (cat) (dog) hair and dander: Secondary | ICD-10-CM | POA: Diagnosis not present

## 2023-06-20 DIAGNOSIS — J3089 Other allergic rhinitis: Secondary | ICD-10-CM | POA: Diagnosis not present

## 2023-06-20 DIAGNOSIS — J301 Allergic rhinitis due to pollen: Secondary | ICD-10-CM | POA: Diagnosis not present

## 2023-06-22 DIAGNOSIS — E7800 Pure hypercholesterolemia, unspecified: Secondary | ICD-10-CM | POA: Diagnosis not present

## 2023-06-22 DIAGNOSIS — Z125 Encounter for screening for malignant neoplasm of prostate: Secondary | ICD-10-CM | POA: Diagnosis not present

## 2023-07-06 DIAGNOSIS — M79605 Pain in left leg: Secondary | ICD-10-CM | POA: Diagnosis not present

## 2023-07-18 DIAGNOSIS — J301 Allergic rhinitis due to pollen: Secondary | ICD-10-CM | POA: Diagnosis not present

## 2023-08-02 DIAGNOSIS — J3089 Other allergic rhinitis: Secondary | ICD-10-CM | POA: Diagnosis not present

## 2023-08-02 DIAGNOSIS — J301 Allergic rhinitis due to pollen: Secondary | ICD-10-CM | POA: Diagnosis not present

## 2023-08-02 DIAGNOSIS — J3081 Allergic rhinitis due to animal (cat) (dog) hair and dander: Secondary | ICD-10-CM | POA: Diagnosis not present

## 2023-09-03 DIAGNOSIS — J3081 Allergic rhinitis due to animal (cat) (dog) hair and dander: Secondary | ICD-10-CM | POA: Diagnosis not present

## 2023-09-03 DIAGNOSIS — J3089 Other allergic rhinitis: Secondary | ICD-10-CM | POA: Diagnosis not present

## 2023-09-03 DIAGNOSIS — J301 Allergic rhinitis due to pollen: Secondary | ICD-10-CM | POA: Diagnosis not present

## 2023-09-05 DIAGNOSIS — J4521 Mild intermittent asthma with (acute) exacerbation: Secondary | ICD-10-CM | POA: Diagnosis not present

## 2023-09-05 DIAGNOSIS — J3081 Allergic rhinitis due to animal (cat) (dog) hair and dander: Secondary | ICD-10-CM | POA: Diagnosis not present

## 2023-09-05 DIAGNOSIS — J4520 Mild intermittent asthma, uncomplicated: Secondary | ICD-10-CM | POA: Diagnosis not present

## 2023-09-05 DIAGNOSIS — J301 Allergic rhinitis due to pollen: Secondary | ICD-10-CM | POA: Diagnosis not present

## 2023-09-08 DIAGNOSIS — J309 Allergic rhinitis, unspecified: Secondary | ICD-10-CM | POA: Diagnosis not present

## 2023-09-08 DIAGNOSIS — Z03818 Encounter for observation for suspected exposure to other biological agents ruled out: Secondary | ICD-10-CM | POA: Diagnosis not present

## 2023-09-08 DIAGNOSIS — R051 Acute cough: Secondary | ICD-10-CM | POA: Diagnosis not present

## 2023-09-11 DIAGNOSIS — J3089 Other allergic rhinitis: Secondary | ICD-10-CM | POA: Diagnosis not present

## 2023-09-11 DIAGNOSIS — J3081 Allergic rhinitis due to animal (cat) (dog) hair and dander: Secondary | ICD-10-CM | POA: Diagnosis not present

## 2023-09-11 DIAGNOSIS — J301 Allergic rhinitis due to pollen: Secondary | ICD-10-CM | POA: Diagnosis not present

## 2023-09-11 DIAGNOSIS — J4530 Mild persistent asthma, uncomplicated: Secondary | ICD-10-CM | POA: Diagnosis not present

## 2023-09-18 DIAGNOSIS — J3081 Allergic rhinitis due to animal (cat) (dog) hair and dander: Secondary | ICD-10-CM | POA: Diagnosis not present

## 2023-09-18 DIAGNOSIS — J301 Allergic rhinitis due to pollen: Secondary | ICD-10-CM | POA: Diagnosis not present

## 2023-09-18 DIAGNOSIS — J3089 Other allergic rhinitis: Secondary | ICD-10-CM | POA: Diagnosis not present

## 2023-09-27 DIAGNOSIS — J301 Allergic rhinitis due to pollen: Secondary | ICD-10-CM | POA: Diagnosis not present

## 2023-09-27 DIAGNOSIS — J3089 Other allergic rhinitis: Secondary | ICD-10-CM | POA: Diagnosis not present

## 2023-10-04 DIAGNOSIS — J3089 Other allergic rhinitis: Secondary | ICD-10-CM | POA: Diagnosis not present

## 2023-10-04 DIAGNOSIS — J301 Allergic rhinitis due to pollen: Secondary | ICD-10-CM | POA: Diagnosis not present

## 2023-10-11 DIAGNOSIS — J301 Allergic rhinitis due to pollen: Secondary | ICD-10-CM | POA: Diagnosis not present

## 2023-10-18 DIAGNOSIS — J301 Allergic rhinitis due to pollen: Secondary | ICD-10-CM | POA: Diagnosis not present

## 2023-10-23 DIAGNOSIS — J301 Allergic rhinitis due to pollen: Secondary | ICD-10-CM | POA: Diagnosis not present

## 2023-12-03 DIAGNOSIS — J3081 Allergic rhinitis due to animal (cat) (dog) hair and dander: Secondary | ICD-10-CM | POA: Diagnosis not present

## 2023-12-03 DIAGNOSIS — J301 Allergic rhinitis due to pollen: Secondary | ICD-10-CM | POA: Diagnosis not present

## 2023-12-03 DIAGNOSIS — J3089 Other allergic rhinitis: Secondary | ICD-10-CM | POA: Diagnosis not present

## 2023-12-05 DIAGNOSIS — R600 Localized edema: Secondary | ICD-10-CM | POA: Diagnosis not present

## 2023-12-21 DIAGNOSIS — B351 Tinea unguium: Secondary | ICD-10-CM | POA: Diagnosis not present

## 2023-12-21 DIAGNOSIS — R600 Localized edema: Secondary | ICD-10-CM | POA: Diagnosis not present

## 2023-12-28 DIAGNOSIS — J3089 Other allergic rhinitis: Secondary | ICD-10-CM | POA: Diagnosis not present

## 2023-12-28 DIAGNOSIS — J3081 Allergic rhinitis due to animal (cat) (dog) hair and dander: Secondary | ICD-10-CM | POA: Diagnosis not present

## 2023-12-28 DIAGNOSIS — J301 Allergic rhinitis due to pollen: Secondary | ICD-10-CM | POA: Diagnosis not present

## 2024-01-04 ENCOUNTER — Ambulatory Visit: Admitting: Podiatry

## 2024-01-04 ENCOUNTER — Encounter: Payer: Self-pay | Admitting: Podiatry

## 2024-01-04 DIAGNOSIS — L601 Onycholysis: Secondary | ICD-10-CM | POA: Diagnosis not present

## 2024-01-04 DIAGNOSIS — L603 Nail dystrophy: Secondary | ICD-10-CM | POA: Diagnosis not present

## 2024-01-04 DIAGNOSIS — B351 Tinea unguium: Secondary | ICD-10-CM | POA: Diagnosis not present

## 2024-01-04 NOTE — Patient Instructions (Signed)

## 2024-01-07 NOTE — Progress Notes (Signed)
  Subjective:  Patient ID: Todd Dawson, male    DOB: 06-04-1946,  MRN: [id number]  Chief Complaint  Patient presents with   Nail Problem    Patient states that he is here for the doctor to look at patient bilateral hallux toe nails, and also that 2 months ago patient had swollen bilateral feet, no pain,no injury but they became swollen.    Discussed the use of AI scribe software for clinical note transcription with the patient, who gave verbal consent to proceed.  History of Present Illness The patient, with a history of toenail issues, presents with discomfort in both big toes. The toenails have been growing abnormally for about a year or two, causing discomfort, particularly when wearing athletic shoes. The patient has tried various treatments with little success. The toenail on the left foot has become particularly problematic, growing in an abnormal direction and causing discomfort. The toenail on the right foot is starting to break but is not causing pain. The patient is active, regularly running and going to the gym, which may contribute to the condition of the toenails.      Objective:    Physical Exam General: AAO x3, NAD  Dermatological: Bilateral hallux insert dystrophic, hypertrophic and brittle.  There is no edema, erythema or signs infection.  No open lesions.  Vascular: Dorsalis Pedis artery and Posterior Tibial artery pedal pulses are 2/4 bilateral with immedate capillary fill time.  There is no pain with calf compression, swelling, warmth, erythema.   Neruologic: Grossly intact via light touch bilateral.   Musculoskeletal: No other areas of discomfort.    No images are attached to the encounter.    Results Procedure: Toenail Trimming and Filing Description: Trimmed and filed the loose and damaged nails on both great toes. Sent nail samples to the lab for culture. Informed Consent: Discussed options including oral and topical medications, and nail removal.  Explained the risks, benefits, and alternatives of each option. Patient consented to trimming and filing with lab culture.   Assessment:   1. Dermatophytosis of nail      Plan:  Patient was evaluated and treated and all questions answered.  Assessment and Plan Assessment & Plan Onychomycosis with toenail damage Chronic onychomycosis affecting both halluces, left hallux more painful and deviated. Right hallux primarily cosmetic issue. Discussed treatment options: oral terbinafine (monitor liver function), topical cream (less effective), nail removal (risk of poor regrowth). Opted for conservative management. - Trim and file both halluces. - Send nail samples for fungal culture. - Consider oral terbinafine if culture confirms fungal infection. - Consider topical compound cream with antifungal and urea if appropriate. - Reassess based on culture results and symptoms.      Return for nail culture results.   Vivi Barrack DPM

## 2024-01-24 ENCOUNTER — Other Ambulatory Visit: Payer: Self-pay | Admitting: Podiatry

## 2024-01-24 ENCOUNTER — Encounter: Payer: Self-pay | Admitting: Podiatry

## 2024-01-24 DIAGNOSIS — J301 Allergic rhinitis due to pollen: Secondary | ICD-10-CM | POA: Diagnosis not present

## 2024-01-24 DIAGNOSIS — J3089 Other allergic rhinitis: Secondary | ICD-10-CM | POA: Diagnosis not present

## 2024-02-01 DIAGNOSIS — T148XXA Other injury of unspecified body region, initial encounter: Secondary | ICD-10-CM | POA: Diagnosis not present

## 2024-02-01 DIAGNOSIS — W19XXXA Unspecified fall, initial encounter: Secondary | ICD-10-CM | POA: Diagnosis not present

## 2024-02-01 DIAGNOSIS — M25551 Pain in right hip: Secondary | ICD-10-CM | POA: Diagnosis not present

## 2024-02-04 DIAGNOSIS — M25551 Pain in right hip: Secondary | ICD-10-CM | POA: Diagnosis not present

## 2024-02-04 DIAGNOSIS — W19XXXD Unspecified fall, subsequent encounter: Secondary | ICD-10-CM | POA: Diagnosis not present

## 2024-02-04 DIAGNOSIS — T148XXA Other injury of unspecified body region, initial encounter: Secondary | ICD-10-CM | POA: Diagnosis not present

## 2024-02-05 DIAGNOSIS — J301 Allergic rhinitis due to pollen: Secondary | ICD-10-CM | POA: Diagnosis not present

## 2024-02-06 NOTE — Progress Notes (Unsigned)
    Todd Dawson D.[facility] [address] [place] [address] Phone: [phone]   Assessment and Plan:     There are no diagnoses linked to this encounter.  ***   Pertinent previous records reviewed include ***    Follow Up: ***     Subjective:   I, Todd Dawson, am serving as a Neurosurgeon for Doctor Todd Dawson  Chief Complaint: low back pain   HPI:   02/07/2024 Patient is a 78 year old male with low back pain. Patient states  Relevant Historical Information: ***  Additional pertinent review of systems negative.   Current Outpatient Medications:    Ascorbic Acid (VITAMIN C PO), Take 1 tablet by mouth daily., Disp: , Rfl:    atenolol (TENORMIN) 25 MG tablet, Take 50 mg by mouth every morning. , Disp: , Rfl: 3   b complex vitamins tablet, Take 1 tablet by mouth daily., Disp: , Rfl:    Cholecalciferol (VITAMIN D PO), Take 1 tablet by mouth daily., Disp: , Rfl:    Multiple Vitamin (MULTIVITAMIN WITH MINERALS) TABS tablet, Take 1 tablet by mouth daily., Disp: , Rfl:    oxyCODONE -acetaminophen  (PERCOCET) 5-325 MG tablet, Take 1 tablet by mouth every 6 (six) hours as needed for severe pain., Disp: 15 tablet, Rfl: 0   phenazopyridine  (PYRIDIUM ) 200 MG tablet, Take 1 tablet (200 mg total) by mouth 3 (three) times daily as needed for pain., Disp: 10 tablet, Rfl: 0   tamsulosin  (FLOMAX ) 0.4 MG CAPS capsule, Take 1 capsule (0.4 mg total) by mouth daily., Disp: 10 capsule, Rfl: 0   Objective:     There were no vitals filed for this visit.    There is no height or weight on file to calculate BMI.    Physical Exam:    ***   Electronically signed by:  Marshall Skeeter D.[facility] 7:46 AM 02/06/24

## 2024-02-07 ENCOUNTER — Ambulatory Visit (INDEPENDENT_AMBULATORY_CARE_PROVIDER_SITE_OTHER): Admitting: Sports Medicine

## 2024-02-07 VITALS — BP 120/84 | HR 49 | Ht 71.0 in | Wt 191.0 lb

## 2024-02-07 DIAGNOSIS — S301XXA Contusion of abdominal wall, initial encounter: Secondary | ICD-10-CM | POA: Diagnosis not present

## 2024-02-07 NOTE — Patient Instructions (Signed)
 Core HEP  Continue previous medicines for 2 weeks and then as needed  As needed follow up

## 2024-02-26 DIAGNOSIS — J3089 Other allergic rhinitis: Secondary | ICD-10-CM | POA: Diagnosis not present

## 2024-02-26 DIAGNOSIS — J3081 Allergic rhinitis due to animal (cat) (dog) hair and dander: Secondary | ICD-10-CM | POA: Diagnosis not present

## 2024-02-26 DIAGNOSIS — J301 Allergic rhinitis due to pollen: Secondary | ICD-10-CM | POA: Diagnosis not present

## 2024-03-03 DIAGNOSIS — J3089 Other allergic rhinitis: Secondary | ICD-10-CM | POA: Diagnosis not present

## 2024-03-03 DIAGNOSIS — J301 Allergic rhinitis due to pollen: Secondary | ICD-10-CM | POA: Diagnosis not present

## 2024-03-03 DIAGNOSIS — J3081 Allergic rhinitis due to animal (cat) (dog) hair and dander: Secondary | ICD-10-CM | POA: Diagnosis not present

## 2024-03-18 DIAGNOSIS — J301 Allergic rhinitis due to pollen: Secondary | ICD-10-CM | POA: Diagnosis not present

## 2024-03-18 DIAGNOSIS — J3081 Allergic rhinitis due to animal (cat) (dog) hair and dander: Secondary | ICD-10-CM | POA: Diagnosis not present

## 2024-03-18 DIAGNOSIS — J3089 Other allergic rhinitis: Secondary | ICD-10-CM | POA: Diagnosis not present

## 2024-03-31 DIAGNOSIS — E7800 Pure hypercholesterolemia, unspecified: Secondary | ICD-10-CM | POA: Diagnosis not present

## 2024-04-08 DIAGNOSIS — J3089 Other allergic rhinitis: Secondary | ICD-10-CM | POA: Diagnosis not present

## 2024-04-08 DIAGNOSIS — J301 Allergic rhinitis due to pollen: Secondary | ICD-10-CM | POA: Diagnosis not present

## 2024-04-08 DIAGNOSIS — J3081 Allergic rhinitis due to animal (cat) (dog) hair and dander: Secondary | ICD-10-CM | POA: Diagnosis not present

## 2024-05-01 DIAGNOSIS — E7800 Pure hypercholesterolemia, unspecified: Secondary | ICD-10-CM | POA: Diagnosis not present

## 2024-05-08 DIAGNOSIS — J3089 Other allergic rhinitis: Secondary | ICD-10-CM | POA: Diagnosis not present

## 2024-05-08 DIAGNOSIS — J3081 Allergic rhinitis due to animal (cat) (dog) hair and dander: Secondary | ICD-10-CM | POA: Diagnosis not present

## 2024-05-08 DIAGNOSIS — J301 Allergic rhinitis due to pollen: Secondary | ICD-10-CM | POA: Diagnosis not present

## 2024-06-01 DIAGNOSIS — E7800 Pure hypercholesterolemia, unspecified: Secondary | ICD-10-CM | POA: Diagnosis not present

## 2024-06-05 DIAGNOSIS — J3089 Other allergic rhinitis: Secondary | ICD-10-CM | POA: Diagnosis not present

## 2024-06-05 DIAGNOSIS — Z91010 Allergy to peanuts: Secondary | ICD-10-CM | POA: Diagnosis not present

## 2024-06-05 DIAGNOSIS — Z5181 Encounter for therapeutic drug level monitoring: Secondary | ICD-10-CM | POA: Diagnosis not present

## 2024-06-05 DIAGNOSIS — J4530 Mild persistent asthma, uncomplicated: Secondary | ICD-10-CM | POA: Diagnosis not present

## 2024-06-10 DIAGNOSIS — J301 Allergic rhinitis due to pollen: Secondary | ICD-10-CM | POA: Diagnosis not present

## 2024-06-16 DIAGNOSIS — Z23 Encounter for immunization: Secondary | ICD-10-CM | POA: Diagnosis not present

## 2024-06-16 DIAGNOSIS — Z1331 Encounter for screening for depression: Secondary | ICD-10-CM | POA: Diagnosis not present

## 2024-06-16 DIAGNOSIS — Z0000 Encounter for general adult medical examination without abnormal findings: Secondary | ICD-10-CM | POA: Diagnosis not present

## 2024-06-17 DIAGNOSIS — Z125 Encounter for screening for malignant neoplasm of prostate: Secondary | ICD-10-CM | POA: Diagnosis not present

## 2024-06-17 DIAGNOSIS — Z79899 Other long term (current) drug therapy: Secondary | ICD-10-CM | POA: Diagnosis not present

## 2024-06-17 DIAGNOSIS — E7800 Pure hypercholesterolemia, unspecified: Secondary | ICD-10-CM | POA: Diagnosis not present

## 2024-07-01 DIAGNOSIS — E7800 Pure hypercholesterolemia, unspecified: Secondary | ICD-10-CM | POA: Diagnosis not present

## 2024-07-09 DIAGNOSIS — J301 Allergic rhinitis due to pollen: Secondary | ICD-10-CM | POA: Diagnosis not present

## 2024-07-09 DIAGNOSIS — J3081 Allergic rhinitis due to animal (cat) (dog) hair and dander: Secondary | ICD-10-CM | POA: Diagnosis not present

## 2024-07-09 DIAGNOSIS — J3089 Other allergic rhinitis: Secondary | ICD-10-CM | POA: Diagnosis not present

## 2024-07-16 DIAGNOSIS — J301 Allergic rhinitis due to pollen: Secondary | ICD-10-CM | POA: Diagnosis not present

## 2024-07-24 DIAGNOSIS — J3081 Allergic rhinitis due to animal (cat) (dog) hair and dander: Secondary | ICD-10-CM | POA: Diagnosis not present

## 2024-07-24 DIAGNOSIS — J3089 Other allergic rhinitis: Secondary | ICD-10-CM | POA: Diagnosis not present

## 2024-07-24 DIAGNOSIS — J301 Allergic rhinitis due to pollen: Secondary | ICD-10-CM | POA: Diagnosis not present

## 2024-07-30 DIAGNOSIS — Z860101 Personal history of adenomatous and serrated colon polyps: Secondary | ICD-10-CM | POA: Diagnosis not present

## 2024-07-31 DIAGNOSIS — J3089 Other allergic rhinitis: Secondary | ICD-10-CM | POA: Diagnosis not present

## 2024-07-31 DIAGNOSIS — J3081 Allergic rhinitis due to animal (cat) (dog) hair and dander: Secondary | ICD-10-CM | POA: Diagnosis not present

## 2024-07-31 DIAGNOSIS — J301 Allergic rhinitis due to pollen: Secondary | ICD-10-CM | POA: Diagnosis not present

## 2024-08-05 DIAGNOSIS — J3081 Allergic rhinitis due to animal (cat) (dog) hair and dander: Secondary | ICD-10-CM | POA: Diagnosis not present

## 2024-08-05 DIAGNOSIS — J3089 Other allergic rhinitis: Secondary | ICD-10-CM | POA: Diagnosis not present

## 2024-08-05 DIAGNOSIS — J301 Allergic rhinitis due to pollen: Secondary | ICD-10-CM | POA: Diagnosis not present

## 2024-08-11 DIAGNOSIS — J3081 Allergic rhinitis due to animal (cat) (dog) hair and dander: Secondary | ICD-10-CM | POA: Diagnosis not present

## 2024-08-11 DIAGNOSIS — J3089 Other allergic rhinitis: Secondary | ICD-10-CM | POA: Diagnosis not present

## 2024-08-11 DIAGNOSIS — J301 Allergic rhinitis due to pollen: Secondary | ICD-10-CM | POA: Diagnosis not present

## 2024-09-11 DIAGNOSIS — J3081 Allergic rhinitis due to animal (cat) (dog) hair and dander: Secondary | ICD-10-CM | POA: Diagnosis not present

## 2024-09-11 DIAGNOSIS — J3089 Other allergic rhinitis: Secondary | ICD-10-CM | POA: Diagnosis not present

## 2024-09-11 DIAGNOSIS — J301 Allergic rhinitis due to pollen: Secondary | ICD-10-CM | POA: Diagnosis not present
